# Patient Record
Sex: Female | Born: 1980 | Race: Black or African American | Hispanic: No | Marital: Single | Smoking: Former smoker
Health system: Southern US, Community
[De-identification: ages and names within clinical notes are randomized; demographics above are authoritative.]

## PROBLEM LIST (undated history)

## (undated) DIAGNOSIS — F419 Anxiety disorder, unspecified: Secondary | ICD-10-CM

## (undated) DIAGNOSIS — E669 Obesity, unspecified: Secondary | ICD-10-CM

## (undated) DIAGNOSIS — E7800 Pure hypercholesterolemia, unspecified: Secondary | ICD-10-CM

## (undated) DIAGNOSIS — F32A Depression, unspecified: Secondary | ICD-10-CM

## (undated) DIAGNOSIS — I10 Essential (primary) hypertension: Secondary | ICD-10-CM

## (undated) DIAGNOSIS — R12 Heartburn: Secondary | ICD-10-CM

## (undated) DIAGNOSIS — M1990 Unspecified osteoarthritis, unspecified site: Secondary | ICD-10-CM

## (undated) HISTORY — DX: Anxiety disorder, unspecified: F41.9

## (undated) HISTORY — DX: Heartburn: R12

## (undated) HISTORY — DX: Depression, unspecified: F32.A

## (undated) HISTORY — DX: Unspecified osteoarthritis, unspecified site: M19.90

---

## 2012-10-03 ENCOUNTER — Ambulatory Visit: Payer: Self-pay | Admitting: Physician Assistant

## 2013-01-20 ENCOUNTER — Emergency Department: Payer: Self-pay | Admitting: Emergency Medicine

## 2013-04-05 ENCOUNTER — Emergency Department: Payer: Self-pay | Admitting: Emergency Medicine

## 2013-07-12 ENCOUNTER — Emergency Department: Payer: Self-pay | Admitting: Emergency Medicine

## 2015-02-14 ENCOUNTER — Emergency Department (HOSPITAL_COMMUNITY): Payer: Self-pay

## 2015-02-14 ENCOUNTER — Encounter (HOSPITAL_COMMUNITY): Payer: Self-pay | Admitting: Emergency Medicine

## 2015-02-14 ENCOUNTER — Emergency Department (HOSPITAL_COMMUNITY)
Admission: EM | Admit: 2015-02-14 | Discharge: 2015-02-14 | Disposition: A | Discharge status: Home / Self Care | Payer: Self-pay | Attending: Emergency Medicine | Admitting: Emergency Medicine

## 2015-02-14 DIAGNOSIS — I10 Essential (primary) hypertension: Secondary | ICD-10-CM | POA: Insufficient documentation

## 2015-02-14 DIAGNOSIS — Z3202 Encounter for pregnancy test, result negative: Secondary | ICD-10-CM | POA: Insufficient documentation

## 2015-02-14 DIAGNOSIS — Z8639 Personal history of other endocrine, nutritional and metabolic disease: Secondary | ICD-10-CM | POA: Insufficient documentation

## 2015-02-14 DIAGNOSIS — E663 Overweight: Secondary | ICD-10-CM | POA: Insufficient documentation

## 2015-02-14 DIAGNOSIS — Z87891 Personal history of nicotine dependence: Secondary | ICD-10-CM | POA: Insufficient documentation

## 2015-02-14 DIAGNOSIS — R0789 Other chest pain: Secondary | ICD-10-CM | POA: Insufficient documentation

## 2015-02-14 HISTORY — DX: Essential (primary) hypertension: I10

## 2015-02-14 HISTORY — DX: Pure hypercholesterolemia, unspecified: E78.00

## 2015-02-14 LAB — COMPREHENSIVE METABOLIC PANEL
ALK PHOS: 52 U/L (ref 38–126)
ALT: 16 U/L (ref 14–54)
AST: 24 U/L (ref 15–41)
Albumin: 3.4 g/dL — ABNORMAL LOW (ref 3.5–5.0)
Anion gap: 10 (ref 5–15)
BILIRUBIN TOTAL: 0.5 mg/dL (ref 0.3–1.2)
BUN: 7 mg/dL (ref 6–20)
CALCIUM: 8.9 mg/dL (ref 8.9–10.3)
CO2: 27 mmol/L (ref 22–32)
CREATININE: 0.97 mg/dL (ref 0.44–1.00)
Chloride: 102 mmol/L (ref 101–111)
GFR calc Af Amer: >60 mL/min (ref >60)
GFR calc non Af Amer: >60 mL/min (ref >60)
GLUCOSE: 97 mg/dL (ref 65–99)
Potassium: 3.9 mmol/L (ref 3.5–5.1)
Sodium: 139 mmol/L (ref 135–145)
Total Protein: 6.6 g/dL (ref 6.5–8.1)

## 2015-02-14 LAB — CBC WITH DIFFERENTIAL/PLATELET
Basophils Absolute: 0 10*3/uL (ref 0.0–0.1)
Basophils Relative: 0 %
EOS ABS: 0.2 10*3/uL (ref 0.0–0.7)
Eosinophils Relative: 2 %
HCT: 37 % (ref 36.0–46.0)
HEMOGLOBIN: 11.9 g/dL — AB (ref 12.0–15.0)
LYMPHS ABS: 1.9 10*3/uL (ref 0.7–4.0)
Lymphocytes Relative: 29 %
MCH: 25.5 pg — AB (ref 26.0–34.0)
MCHC: 32.2 g/dL (ref 30.0–36.0)
MCV: 79.4 fL (ref 78.0–100.0)
MONOS PCT: 5 %
Monocytes Absolute: 0.3 10*3/uL (ref 0.1–1.0)
NEUTROS PCT: 64 %
Neutro Abs: 4.2 10*3/uL (ref 1.7–7.7)
Platelets: 253 10*3/uL (ref 150–400)
RBC: 4.66 MIL/uL (ref 3.87–5.11)
RDW: 15.1 % (ref 11.5–15.5)
WBC: 6.6 10*3/uL (ref 4.0–10.5)

## 2015-02-14 LAB — I-STAT CHEM 8, ED
BUN: 11 mg/dL (ref 6–20)
CREATININE: 0.9 mg/dL (ref 0.44–1.00)
Calcium, Ion: 1.1 mmol/L — ABNORMAL LOW (ref 1.12–1.23)
Chloride: 101 mmol/L (ref 101–111)
Glucose, Bld: 86 mg/dL (ref 65–99)
HEMATOCRIT: 40 % (ref 36.0–46.0)
HEMOGLOBIN: 13.6 g/dL (ref 12.0–15.0)
Potassium: 3.8 mmol/L (ref 3.5–5.1)
Sodium: 137 mmol/L (ref 135–145)
TCO2: 27 mmol/L (ref 0–100)

## 2015-02-14 LAB — I-STAT BETA HCG BLOOD, ED (MC, WL, AP ONLY): I-stat hCG, quantitative: <5 m[IU]/mL (ref <5)

## 2015-02-14 LAB — I-STAT TROPONIN, ED: TROPONIN I, POC: 0 ng/mL (ref 0.00–0.08)

## 2015-02-14 MED ORDER — IBUPROFEN 800 MG PO TABS: 800.0000 mg | ORAL_TABLET | Freq: Three times a day (TID) | ORAL | Status: DC

## 2015-02-14 MED ORDER — CYCLOBENZAPRINE HCL 10 MG PO TABS
5.0000 mg | ORAL_TABLET | Freq: Once | ORAL | Status: AC
  Administered 2015-02-14: 5 mg via ORAL
  Filled 2015-02-14: qty 1

## 2015-02-14 MED ORDER — KETOROLAC TROMETHAMINE 30 MG/ML IJ SOLN
30.0000 mg | Freq: Once | INTRAMUSCULAR | Status: AC
  Administered 2015-02-14: 30 mg via INTRAVENOUS
  Filled 2015-02-14: qty 1

## 2015-02-14 MED ORDER — CYCLOBENZAPRINE HCL 10 MG PO TABS: 10.0000 mg | ORAL_TABLET | Freq: Two times a day (BID) | ORAL | Status: DC | PRN

## 2015-02-14 NOTE — Discharge Instructions (Signed)
Take motrin for pain.  Take flexeril for muscle spasms.   See your doctor.   Return to ER if you have worse chest pain, trouble breathing, shortness of breath.

## 2015-02-14 NOTE — ED Provider Notes (Signed)
CSN: [id number]     Arrival date & time 02/14/15  2100 History   First MD Initiated Contact with Patient 02/14/15 2101     Chief Complaint  Patient presents with  . Chest Pain     (Consider location/radiation/quality/duration/timing/severity/associated sxs/prior Treatment) The history is provided by the patient.  Sharon Jennings is a 34 y.o. female hx of HTN, HL here with chest pain. Patient states that she has chest heaviness for the last week or so. She states that she sometimes feels some sharp stabbing pain that comes and goes. She has some subjective foods of breath. Denies any palpitations and denies any recent travel or leg swelling. No history of DVT or PE. Patient was a former smoker. She has family hx of CAD but no personal history. She has no family hx of early cardiac death.   Past Medical History  Diagnosis Date  . Hypertension   . High cholesterol    Past Surgical History  Procedure Laterality Date  . Cesarean section  2005   History reviewed. No pertinent family history. Social History  Substance Use Topics  . Smoking status: Former Smoker    Quit date: 02/21/2014  . Smokeless tobacco: None  . Alcohol Use: No   OB History    No data available     Review of Systems  Cardiovascular: Positive for chest pain.  All other systems reviewed and are negative.     Allergies  Ranitidine and Nexium  Home Medications   Prior to Admission medications   Medication Sig Start Date End Date Taking? Authorizing Provider  ibuprofen (ADVIL,MOTRIN) 400 MG tablet Take 400 mg by mouth every 6 (six) hours as needed for moderate pain.   Yes Historical Provider, MD   BP 133/92 mmHg  Pulse 64  Temp(Src) 97.9 F (36.6 C) (Oral)  Resp 25  SpO2 96%  LMP 01/31/2015 (Within Days) Physical Exam  Constitutional: She is oriented to person, place, and time. She appears well-developed and well-nourished.  Overweight   HENT:  Head: Normocephalic.  Mouth/Throat: Oropharynx is  clear and moist.  Eyes: Conjunctivae and EOM are normal. Pupils are equal, round, and reactive to light.  Neck: Normal range of motion. Neck supple.  Cardiovascular: Normal rate, regular rhythm and normal heart sounds.   Pulmonary/Chest: Effort normal and breath sounds normal. No respiratory distress. She has no wheezes. She has no rales.  + reproducible L chest tenderness.   Abdominal: Soft. Bowel sounds are normal. She exhibits no distension. There is no tenderness. There is no rebound.  Musculoskeletal: Normal range of motion. She exhibits no edema or tenderness.  Neurological: She is alert and oriented to person, place, and time. No cranial nerve deficit. Coordination normal.  Skin: Skin is warm and dry.  Psychiatric: She has a normal mood and affect. Her behavior is normal. Judgment and thought content normal.  Nursing note and vitals reviewed.   ED Course  Procedures (including critical care time) Labs Review Labs Reviewed  CBC WITH DIFFERENTIAL/PLATELET - Abnormal; Notable for the following:    Hemoglobin 11.9 (*)    MCH 25.5 (*)    All other components within normal limits  COMPREHENSIVE METABOLIC PANEL - Abnormal; Notable for the following:    Albumin 3.4 (*)    All other components within normal limits  I-STAT CHEM 8, ED - Abnormal; Notable for the following:    Calcium, Ion 1.10 (*)    All other components within normal limits  I-STAT TROPOININ, ED  I-STAT  BETA HCG BLOOD, ED ([facility], [facility], [facility] ONLY)    Imaging Review Dg Chest 2 View  02/14/2015  CLINICAL DATA:  Mid chest pain as well as left upper chest pain and left lateral chest pain. EXAM: CHEST  2 VIEW COMPARISON:  10/03/2012 FINDINGS: Lungs are adequately inflated without consolidation or effusion. No pneumothorax. Cardiomediastinal silhouette, bones and soft tissues are within normal. IMPRESSION: No active cardiopulmonary disease. Electronically Signed   By: Elberta Fortisaniel  Boyle M.D.   On: 02/14/2015 22:00   I have personally  reviewed and evaluated these images and lab results as part of my medical decision-making.   EKG Interpretation   Date/Time:  Thursday February 14 2015 21:03:59 EST Ventricular Rate:  71 PR Interval:  142 QRS Duration: 88 QT Interval:  394 QTC Calculation: 428 R Axis:   87 Text Interpretation:  Sinus rhythm Consider left atrial enlargement Low  voltage, precordial leads No previous ECGs available Confirmed by Elan Mcelvain  MD,  Damen Windsor ([id number]) on 02/14/2015 9:13:34 PM      MDM   Final diagnoses:  None    Sharon Jennings is a 34 y.o. female here with chest pain, appears reproducible. Chest pain for about a week. Not on birth control. Trop x 1 sufficient, PERC neg so doesn't need D-dimer. Will get labs, trop, CXR.   11:17 PM Labs and CXR unremarkable. Pain improved with motrin, flexeril. Likely chest wall muscle pain. Will dc home.    Richardean Canalavid H Clela Hagadorn, MD 02/14/15 [phone]

## 2015-02-14 NOTE — ED Notes (Signed)
Pt reports chest heaviness with intermittent sharp pains x 1 week. Pt also reports SOB. Pt received 1 NTG en route with no relief. Pt also received 324 ASA. CBG 99.

## 2015-04-28 ENCOUNTER — Encounter (HOSPITAL_COMMUNITY): Payer: Self-pay | Admitting: Emergency Medicine

## 2015-04-28 DIAGNOSIS — I10 Essential (primary) hypertension: Secondary | ICD-10-CM | POA: Insufficient documentation

## 2015-04-28 DIAGNOSIS — N898 Other specified noninflammatory disorders of vagina: Secondary | ICD-10-CM | POA: Insufficient documentation

## 2015-04-28 DIAGNOSIS — R21 Rash and other nonspecific skin eruption: Secondary | ICD-10-CM | POA: Insufficient documentation

## 2015-04-28 LAB — URINALYSIS, ROUTINE W REFLEX MICROSCOPIC
BILIRUBIN URINE: NEGATIVE
GLUCOSE, UA: NEGATIVE mg/dL
HGB URINE DIPSTICK: NEGATIVE
Ketones, ur: NEGATIVE mg/dL
Leukocytes, UA: NEGATIVE
Nitrite: NEGATIVE
PROTEIN: NEGATIVE mg/dL
Specific Gravity, Urine: 1.028 (ref 1.005–1.030)
pH: 7.5 (ref 5.0–8.0)

## 2015-04-28 LAB — POC URINE PREG, ED: PREG TEST UR: NEGATIVE

## 2015-04-28 NOTE — ED Notes (Signed)
C/o generalized rash all over x 3 days.  States it gets better and then comes back worse.  Also reports vaginal itching and clear vaginal discharge x 3 days.

## 2015-04-29 ENCOUNTER — Emergency Department (HOSPITAL_COMMUNITY)
Admission: EM | Admit: 2015-04-29 | Discharge: 2015-04-29 | Disposition: A | Discharge status: Home / Self Care | Payer: Self-pay | Attending: Emergency Medicine | Admitting: Emergency Medicine

## 2015-04-29 NOTE — ED Notes (Signed)
Per registration pt left

## 2016-03-30 ENCOUNTER — Emergency Department
Admission: EM | Admit: 2016-03-30 | Discharge: 2016-03-30 | Disposition: A | Discharge status: Home / Self Care | Payer: Self-pay | Attending: Emergency Medicine | Admitting: Emergency Medicine

## 2016-03-30 ENCOUNTER — Encounter: Payer: Self-pay | Admitting: Emergency Medicine

## 2016-03-30 DIAGNOSIS — I10 Essential (primary) hypertension: Secondary | ICD-10-CM | POA: Insufficient documentation

## 2016-03-30 DIAGNOSIS — K5901 Slow transit constipation: Secondary | ICD-10-CM | POA: Insufficient documentation

## 2016-03-30 DIAGNOSIS — Z87891 Personal history of nicotine dependence: Secondary | ICD-10-CM | POA: Insufficient documentation

## 2016-03-30 DIAGNOSIS — R1012 Left upper quadrant pain: Secondary | ICD-10-CM

## 2016-03-30 LAB — COMPREHENSIVE METABOLIC PANEL
ALBUMIN: 3.9 g/dL (ref 3.5–5.0)
ALK PHOS: 43 U/L (ref 38–126)
ALT: 16 U/L (ref 14–54)
ANION GAP: 6 (ref 5–15)
AST: 28 U/L (ref 15–41)
BUN: 11 mg/dL (ref 6–20)
CO2: 28 mmol/L (ref 22–32)
Calcium: 9 mg/dL (ref 8.9–10.3)
Chloride: 105 mmol/L (ref 101–111)
Creatinine, Ser: 0.91 mg/dL (ref 0.44–1.00)
GFR calc non Af Amer: >60 mL/min (ref >60)
GLUCOSE: 95 mg/dL (ref 65–99)
Potassium: 4 mmol/L (ref 3.5–5.1)
Sodium: 139 mmol/L (ref 135–145)
Total Bilirubin: 0.5 mg/dL (ref 0.3–1.2)
Total Protein: 7.3 g/dL (ref 6.5–8.1)

## 2016-03-30 LAB — CBC
HCT: 39.5 % (ref 35.0–47.0)
HEMOGLOBIN: 13.2 g/dL (ref 12.0–16.0)
MCH: 27 pg (ref 26.0–34.0)
MCHC: 33.4 g/dL (ref 32.0–36.0)
MCV: 80.7 fL (ref 80.0–100.0)
Platelets: 258 10*3/uL (ref 150–440)
RBC: 4.9 MIL/uL (ref 3.80–5.20)
RDW: 15.3 % — AB (ref 11.5–14.5)
WBC: 5.7 10*3/uL (ref 3.6–11.0)

## 2016-03-30 LAB — URINALYSIS, COMPLETE (UACMP) WITH MICROSCOPIC
Bacteria, UA: NONE SEEN
Bilirubin Urine: NEGATIVE
GLUCOSE, UA: NEGATIVE mg/dL
HGB URINE DIPSTICK: NEGATIVE
Ketones, ur: NEGATIVE mg/dL
NITRITE: NEGATIVE
Protein, ur: NEGATIVE mg/dL
SPECIFIC GRAVITY, URINE: 1.017 (ref 1.005–1.030)
pH: 8 (ref 5.0–8.0)

## 2016-03-30 LAB — LIPASE, BLOOD: Lipase: 26 U/L (ref 11–51)

## 2016-03-30 LAB — POCT PREGNANCY, URINE: Preg Test, Ur: NEGATIVE

## 2016-03-30 MED ORDER — HYDROCHLOROTHIAZIDE 25 MG PO TABS: 25.0000 mg | ORAL_TABLET | Freq: Every day | ORAL | 1 refills | Status: DC

## 2016-03-30 MED ORDER — BISACODYL 5 MG PO TBEC: 5.0000 mg | DELAYED_RELEASE_TABLET | Freq: Every day | ORAL | 1 refills | Status: AC | PRN

## 2016-03-30 NOTE — ED Notes (Signed)
States burning and sharp pain LLQ for 3 weeks, states increased pain with eating, states bloated, at present pt awake and alert

## 2016-03-30 NOTE — ED Triage Notes (Signed)
L lower abd pain x 3 weeks, denies diarrhea.

## 2016-03-30 NOTE — ED Provider Notes (Signed)
[facility] Emergency Department Provider Note   ____________________________________________    I have reviewed the triage vital signs and the nursing notes.   HISTORY  Chief Complaint Abdominal Pain     HPI Sharon Jennings is a 36 y.o. female who presents with complaints of aching in her left upper abdomen over the last several weeks. She reports the pain is mild and appears to be worse when she is moving or twisting. She denies nausea or vomiting. She does report a history of constipation and irregular bowel movements. She is concerned because she looked online and is worried that she may have pancreatic cancer. No fevers or chills.    Past Medical History:  Diagnosis Date  . High cholesterol   . Hypertension     There are no active problems to display for this patient.   Past Surgical History:  Procedure Laterality Date  . CESAREAN SECTION  2005    Prior to Admission medications   Medication Sig Start Date End Date Taking? Authorizing Provider  bisacodyl (DULCOLAX) 5 MG EC tablet Take 1 tablet (5 mg total) by mouth daily as needed for moderate constipation. 03/30/16 03/30/17  Jene Every, MD  cyclobenzaprine (FLEXERIL) 10 MG tablet Take 1 tablet (10 mg total) by mouth 2 (two) times daily as needed for muscle spasms. 02/14/15   Charlynne Pander, MD  hydrochlorothiazide (HYDRODIURIL) 25 MG tablet Take 1 tablet (25 mg total) by mouth daily. 03/30/16   Jene Every, MD  ibuprofen (ADVIL,MOTRIN) 800 MG tablet Take 1 tablet (800 mg total) by mouth 3 (three) times daily. 02/14/15   Charlynne Pander, MD     Allergies Ranitidine and Nexium [esomeprazole magnesium]  No family history on file.  Social History Social History  Substance Use Topics  . Smoking status: Former Smoker    Quit date: 02/21/2014  . Smokeless tobacco: Not on file  . Alcohol use No    Review of Systems  Constitutional: No fever/chills  Cardiovascular: Denies chest  pain. Respiratory: Denies shortness of breath. Gastrointestinal: As above Genitourinary: Negative for dysuria. Musculoskeletal: Negative for back pain. Skin: Negative for rash. Neurological: Negative for headaches   10-point ROS otherwise negative.  ____________________________________________   PHYSICAL EXAM:  VITAL SIGNS: ED Triage Vitals  Enc Vitals Group     BP 03/30/16 1223 (!) 164/110     Pulse Rate 03/30/16 1223 68     Resp 03/30/16 1223 18     Temp 03/30/16 1223 98.2 F (36.8 C)     Temp Source 03/30/16 1223 Oral     SpO2 03/30/16 1223 100 %     Weight 03/30/16 1224 240 lb (108.9 kg)     Height 03/30/16 1224 5\' 1"  (1.549 m)     Head Circumference --      Peak Flow --      Pain Score 03/30/16 1224 7     Pain Loc --      Pain Edu? --      Excl. in GC? --     Constitutional: Alert and oriented. No acute distress. Pleasant and interactive Eyes: Conjunctivae are normal.   Nose: No congestion/rhinnorhea. Mouth/Throat: Mucous membranes are moist.   Neck:  Painless ROM Cardiovascular: Normal rate, regular rhythm. Grossly normal heart sounds.  Good peripheral circulation. Respiratory: Normal respiratory effort.  No retractions. Lungs CTAB. Gastrointestinal: Soft and nontender. No distention.  No CVA tenderness. Genitourinary: deferred Musculoskeletal: No lower extremity tenderness nor edema.  Warm and well  perfused Neurologic:  Normal speech and language. No gross focal neurologic deficits are appreciated.  Skin:  Skin is warm, dry and intact. No rash noted. Psychiatric: Mood and affect are normal. Speech and behavior are normal.  ____________________________________________   LABS (all labs ordered are listed, but only abnormal results are displayed)  Labs Reviewed  CBC - Abnormal; Notable for the following:       Result Value   RDW 15.3 (*)    All other components within normal limits  URINALYSIS, COMPLETE (UACMP) WITH MICROSCOPIC - Abnormal; Notable for  the following:    Color, Urine YELLOW (*)    APPearance CLEAR (*)    Leukocytes, UA TRACE (*)    Squamous Epithelial / LPF 0-5 (*)    All other components within normal limits  LIPASE, BLOOD  COMPREHENSIVE METABOLIC PANEL  POC URINE PREG, ED  POCT PREGNANCY, URINE   ____________________________________________  EKG  None ____________________________________________  RADIOLOGY  None ____________________________________________   PROCEDURES  Procedure(s) performed: No    Critical Care performed: No ____________________________________________   INITIAL IMPRESSION / ASSESSMENT AND PLAN / ED COURSE  Pertinent labs & imaging results that were available during my care of the patient were reviewed by me and considered in my medical decision making (see chart for details).  Patient well-appearing and in no acute distress. Her exam is benign. Her lab work is reassuring. I suspect she is having pain from constipation. She is also requesting refill of her blood pressure medication which we will do. Discussed with her the need for outpatient follow-up. Return precautions discussed ____________________________________________   FINAL CLINICAL IMPRESSION(S) / ED DIAGNOSES  Final diagnoses:  Slow transit constipation  Left upper quadrant pain      NEW MEDICATIONS STARTED DURING THIS VISIT:  Discharge Medication List as of 03/30/2016  1:48 PM    START taking these medications   Details  bisacodyl (DULCOLAX) 5 MG EC tablet Take 1 tablet (5 mg total) by mouth daily as needed for moderate constipation., Starting Mon 03/30/2016, Until Tue 03/30/2017, Print    hydrochlorothiazide (HYDRODIURIL) 25 MG tablet Take 1 tablet (25 mg total) by mouth daily., Starting Mon 03/30/2016, Print         Note:  This document was prepared using Dragon voice recognition software and may include unintentional dictation errors.    Jene Everyobert Khari Lett, MD 03/30/16 [phone]

## 2016-09-23 ENCOUNTER — Ambulatory Visit (INDEPENDENT_AMBULATORY_CARE_PROVIDER_SITE_OTHER): Payer: Self-pay | Admitting: Physician Assistant

## 2016-12-02 ENCOUNTER — Ambulatory Visit: Payer: Self-pay | Admitting: Obstetrics

## 2017-03-14 ENCOUNTER — Other Ambulatory Visit: Payer: Self-pay

## 2017-03-14 ENCOUNTER — Encounter (HOSPITAL_COMMUNITY): Payer: Self-pay | Admitting: *Deleted

## 2017-03-14 ENCOUNTER — Emergency Department (HOSPITAL_COMMUNITY): Payer: Private Health Insurance - Indemnity

## 2017-03-14 ENCOUNTER — Emergency Department (HOSPITAL_COMMUNITY)
Admission: EM | Admit: 2017-03-14 | Discharge: 2017-03-14 | Disposition: A | Discharge status: Home / Self Care | Payer: Private Health Insurance - Indemnity | Attending: Emergency Medicine | Admitting: Emergency Medicine

## 2017-03-14 DIAGNOSIS — Z79899 Other long term (current) drug therapy: Secondary | ICD-10-CM | POA: Insufficient documentation

## 2017-03-14 DIAGNOSIS — I10 Essential (primary) hypertension: Secondary | ICD-10-CM | POA: Diagnosis not present

## 2017-03-14 DIAGNOSIS — J111 Influenza due to unidentified influenza virus with other respiratory manifestations: Secondary | ICD-10-CM | POA: Diagnosis not present

## 2017-03-14 DIAGNOSIS — Z87891 Personal history of nicotine dependence: Secondary | ICD-10-CM | POA: Diagnosis not present

## 2017-03-14 DIAGNOSIS — R6889 Other general symptoms and signs: Secondary | ICD-10-CM

## 2017-03-14 DIAGNOSIS — J029 Acute pharyngitis, unspecified: Secondary | ICD-10-CM | POA: Diagnosis present

## 2017-03-14 HISTORY — DX: Obesity, unspecified: E66.9

## 2017-03-14 LAB — URINALYSIS, ROUTINE W REFLEX MICROSCOPIC
Bilirubin Urine: NEGATIVE
Glucose, UA: NEGATIVE mg/dL
Hgb urine dipstick: NEGATIVE
KETONES UR: NEGATIVE mg/dL
LEUKOCYTES UA: NEGATIVE
NITRITE: NEGATIVE
PROTEIN: NEGATIVE mg/dL
Specific Gravity, Urine: 1.019 (ref 1.005–1.030)
pH: 6 (ref 5.0–8.0)

## 2017-03-14 LAB — PREGNANCY, URINE: Preg Test, Ur: NEGATIVE

## 2017-03-14 MED ORDER — GI COCKTAIL ~~LOC~~
30.0000 mL | Freq: Once | ORAL | Status: AC
  Administered 2017-03-14: 30 mL via ORAL
  Filled 2017-03-14: qty 30

## 2017-03-14 MED ORDER — BENZONATATE 100 MG PO CAPS: 100.0000 mg | ORAL_CAPSULE | Freq: Three times a day (TID) | ORAL | 0 refills | Status: DC | PRN

## 2017-03-14 MED ORDER — KETOROLAC TROMETHAMINE 10 MG PO TABS
10.0000 mg | ORAL_TABLET | Freq: Once | ORAL | Status: AC
  Administered 2017-03-14: 10 mg via ORAL
  Filled 2017-03-14: qty 1

## 2017-03-14 MED ORDER — ACETAMINOPHEN 500 MG PO TABS: 500.0000 mg | ORAL_TABLET | Freq: Three times a day (TID) | ORAL | 0 refills | Status: DC | PRN

## 2017-03-14 MED ORDER — BENZONATATE 100 MG PO CAPS
200.0000 mg | ORAL_CAPSULE | Freq: Once | ORAL | Status: AC
  Administered 2017-03-14: 200 mg via ORAL
  Filled 2017-03-14: qty 2

## 2017-03-14 MED ORDER — PHENOL 1.4 % MT LIQD: 1.0000 | OROMUCOSAL | 0 refills | Status: DC | PRN

## 2017-03-14 MED ORDER — IBUPROFEN 600 MG PO TABS: 600.0000 mg | ORAL_TABLET | Freq: Four times a day (QID) | ORAL | 0 refills | Status: DC | PRN

## 2017-03-14 MED ORDER — ACETAMINOPHEN 500 MG PO TABS
1000.0000 mg | ORAL_TABLET | Freq: Once | ORAL | Status: AC
  Administered 2017-03-14: 1000 mg via ORAL
  Filled 2017-03-14: qty 2

## 2017-03-14 NOTE — ED Provider Notes (Signed)
[facility] EMERGENCY DEPARTMENT Provider Note   CSN: [id number] Arrival date & time: 03/14/17  1609     History   Chief Complaint Chief Complaint  Patient presents with  . Cough  . Sore Throat    HPI Sharon Jennings is a 37 y.o. female.   37 year old female with a history of dyslipidemia and hypertension presents to the emergency department for flulike symptoms.  She states that symptoms began with a sore throat yesterday.  She has developed chills as well as subjective fever.  Patient also notes body aches today and a productive cough.  She has had some chest discomfort with coughing and breathing.  With prolonged exertion, she states that she feels short of breath.  Patient also endorsing lower abdominal discomfort.  She believes she has been urinating more frequently.  No dysuria.  She does not recall any specific sick contacts, but works at [employer] and may have come into contact with someone sick there.  She has not had any vomiting or diarrhea.  No medications taken prior to arrival for symptoms.      Past Medical History:  Diagnosis Date  . High cholesterol   . Hypertension   . Obesity     There are no active problems to display for this patient.   Past Surgical History:  Procedure Laterality Date  . CESAREAN SECTION  2005    OB History    No data available       Home Medications    Prior to Admission medications   Medication Sig Start Date End Date Taking? Authorizing Provider  acetaminophen (TYLENOL) 500 MG tablet Take 1 tablet (500 mg total) by mouth every 8 (eight) hours as needed for fever. 03/14/17   Antony Madura, PA-C  benzonatate (TESSALON) 100 MG capsule Take 1-2 capsules (100-200 mg total) by mouth 3 (three) times daily as needed for cough. 03/14/17   Antony Madura, PA-C  bisacodyl (DULCOLAX) 5 MG EC tablet Take 1 tablet (5 mg total) by mouth daily as needed for moderate constipation. 03/30/16 03/30/17  Jene Every, MD    cyclobenzaprine (FLEXERIL) 10 MG tablet Take 1 tablet (10 mg total) by mouth 2 (two) times daily as needed for muscle spasms. 02/14/15   Charlynne Pander, MD  hydrochlorothiazide (HYDRODIURIL) 25 MG tablet Take 1 tablet (25 mg total) by mouth daily. 03/30/16   Jene Every, MD  ibuprofen (ADVIL,MOTRIN) 600 MG tablet Take 1 tablet (600 mg total) by mouth every 6 (six) hours as needed for mild pain, moderate pain or cramping. 03/14/17   Antony Madura, PA-C  phenol (CHLORASEPTIC) 1.4 % LIQD Use as directed 1 spray in the mouth or throat as needed for throat irritation / pain. 03/14/17   Antony Madura, PA-C    Family History History reviewed. No pertinent family history.  Social History Social History   Tobacco Use  . Smoking status: Former Smoker    Last attempt to quit: 02/21/2014    Years since quitting: 3.0  Substance Use Topics  . Alcohol use: No  . Drug use: No     Allergies   Ranitidine and Nexium [esomeprazole magnesium]   Review of Systems Review of Systems Ten systems reviewed and are negative for acute change, except as noted in the HPI.    Physical Exam Updated Vital Signs BP 117/68 (BP Location: Left Arm)   Pulse 72   Temp 98.2 F (36.8 C) (Oral)   Resp 14   LMP 02/28/2017   SpO2  100%   Physical Exam  Constitutional: She is oriented to person, place, and time. She appears well-developed and well-nourished. No distress.  Nontoxic appearing  HENT:  Head: Normocephalic and atraumatic.  Tolerating secretions without difficulty. No tripoding or stridor.  Eyes: Conjunctivae and EOM are normal. No scleral icterus.  Neck: Normal range of motion.  Cardiovascular: Normal rate, regular rhythm and intact distal pulses.  Pulmonary/Chest: Effort normal. No stridor. No respiratory distress. She has no wheezes. She has no rales.  Lungs CTAB. Respirations even and unlabored.  Abdominal: Soft. She exhibits no distension and no mass.  Mild suprapubic abdominal TTP. No masses  or peritoneal signs.  Musculoskeletal: Normal range of motion.  Neurological: She is alert and oriented to person, place, and time. She exhibits normal muscle tone. Coordination normal.  Skin: Skin is warm and dry. No rash noted. She is not diaphoretic. No erythema. No pallor.  Psychiatric: She has a normal mood and affect. Her behavior is normal.  Nursing note and vitals reviewed.    ED Treatments / Results  Labs (all labs ordered are listed, but only abnormal results are displayed) Labs Reviewed  URINALYSIS, ROUTINE W REFLEX MICROSCOPIC  PREGNANCY, URINE    EKG  EKG Interpretation None       Radiology Dg Chest 2 View  Result Date: 03/14/2017 CLINICAL DATA:  Flu-like symptoms since Friday. Cough, sneezing, headache, and body aches. Abdominal pain. Fever 2 days ago. EXAM: CHEST  2 VIEW COMPARISON:  02/14/2015 FINDINGS: The heart size and mediastinal contours are within normal limits. Both lungs are clear. The visualized skeletal structures are unremarkable. IMPRESSION: No active cardiopulmonary disease. Electronically Signed   By: Burman Nieves M.D.   On: 03/14/2017 21:06    Procedures Procedures (including critical care time)  Medications Ordered in ED Medications  ketorolac (TORADOL) tablet 10 mg (10 mg Oral Given 03/14/17 2050)  benzonatate (TESSALON) capsule 200 mg (200 mg Oral Given 03/14/17 2048)  gi cocktail (Maalox,Lidocaine,Donnatal) (30 mLs Oral Given 03/14/17 2050)  acetaminophen (TYLENOL) tablet 1,000 mg (1,000 mg Oral Given 03/14/17 2049)     Initial Impression / Assessment and Plan / ED Course  I have reviewed the triage vital signs and the nursing notes.  Pertinent labs & imaging results that were available during my care of the patient were reviewed by me and considered in my medical decision making (see chart for details).     Patient with symptoms consistent with influenza.  Vitals are stable, afebrile in the ED but with subjective fever PTA.  No  signs of dehydration, tolerating PO's.  Lungs are clear and CXR negative for PNA or other concerning cardiopulmonary abnormality.  She is tolerating secretions.  No meningismus.  No tripoding or stridor.  Patient will be discharged with instructions to orally hydrate, rest, and use NSAIDs for muscle aches and Tylenol for fever.  Patient will also be given a cough suppressant.  Return precautions discussed and provided. Patient discharged in stable condition with no unaddressed concerns.   Final Clinical Impressions(s) / ED Diagnoses   Final diagnoses:  Flu-like symptoms    ED Discharge Orders        Ordered    ibuprofen (ADVIL,MOTRIN) 600 MG tablet  Every 6 hours PRN     03/14/17 2202    acetaminophen (TYLENOL) 500 MG tablet  Every 8 hours PRN     03/14/17 2202    benzonatate (TESSALON) 100 MG capsule  3 times daily PRN     03/14/17 2202  phenol (CHLORASEPTIC) 1.4 % LIQD  As needed     03/14/17 2202       Antony MaduraHumes, Fatumata Kashani, Cordelia Poche-C 03/14/17 2235    Tegeler, Canary Brimhristopher J, MD 03/14/17 [phone]

## 2017-03-14 NOTE — ED Triage Notes (Signed)
Pt has multiple complaints. Reports flu like symptoms since yesterday including headache, sore throat, non productive cough, bodyaches and chest pain when coughing/breathing. No acute distress is noted at triage.

## 2017-03-14 NOTE — Discharge Instructions (Signed)
Get plenty of rest and drink plenty of fluids.  We recommend ibuprofen for body aches and Tylenol for fever.  You may take Tessalon as prescribed for cough.  Use Chloraseptic spray for sore throat.  Follow-up with a primary care doctor as soon as you are able, to ensure resolution of symptoms.  You may return to the emergency department for new or concerning symptoms.

## 2018-05-13 ENCOUNTER — Other Ambulatory Visit: Payer: Self-pay

## 2018-05-13 ENCOUNTER — Emergency Department (HOSPITAL_COMMUNITY)
Admission: EM | Admit: 2018-05-13 | Discharge: 2018-05-13 | Disposition: A | Discharge status: Home / Self Care | Payer: Private Health Insurance - Indemnity | Attending: Emergency Medicine | Admitting: Emergency Medicine

## 2018-05-13 ENCOUNTER — Encounter (HOSPITAL_COMMUNITY): Payer: Self-pay

## 2018-05-13 DIAGNOSIS — Z79899 Other long term (current) drug therapy: Secondary | ICD-10-CM | POA: Insufficient documentation

## 2018-05-13 DIAGNOSIS — E7800 Pure hypercholesterolemia, unspecified: Secondary | ICD-10-CM | POA: Insufficient documentation

## 2018-05-13 DIAGNOSIS — Z87891 Personal history of nicotine dependence: Secondary | ICD-10-CM | POA: Diagnosis not present

## 2018-05-13 DIAGNOSIS — R1012 Left upper quadrant pain: Secondary | ICD-10-CM

## 2018-05-13 DIAGNOSIS — I10 Essential (primary) hypertension: Secondary | ICD-10-CM

## 2018-05-13 LAB — CBC
HCT: 41.1 % (ref 36.0–46.0)
Hemoglobin: 12.7 g/dL (ref 12.0–15.0)
MCH: 25.2 pg — ABNORMAL LOW (ref 26.0–34.0)
MCHC: 30.9 g/dL (ref 30.0–36.0)
MCV: 81.7 fL (ref 80.0–100.0)
NRBC: 0 % (ref 0.0–0.2)
Platelets: 272 10*3/uL (ref 150–400)
RBC: 5.03 MIL/uL (ref 3.87–5.11)
RDW: 15.1 % (ref 11.5–15.5)
WBC: 5.6 10*3/uL (ref 4.0–10.5)

## 2018-05-13 LAB — URINALYSIS, ROUTINE W REFLEX MICROSCOPIC
BILIRUBIN URINE: NEGATIVE
Glucose, UA: NEGATIVE mg/dL
Hgb urine dipstick: NEGATIVE
Ketones, ur: NEGATIVE mg/dL
Leukocytes,Ua: NEGATIVE
NITRITE: NEGATIVE
Protein, ur: NEGATIVE mg/dL
Specific Gravity, Urine: 1.023 (ref 1.005–1.030)
pH: 5 (ref 5.0–8.0)

## 2018-05-13 LAB — COMPREHENSIVE METABOLIC PANEL
ALT: 13 U/L (ref 0–44)
AST: 21 U/L (ref 15–41)
Albumin: 3.5 g/dL (ref 3.5–5.0)
Alkaline Phosphatase: 51 U/L (ref 38–126)
Anion gap: 7 (ref 5–15)
BUN: 12 mg/dL (ref 6–20)
CHLORIDE: 105 mmol/L (ref 98–111)
CO2: 25 mmol/L (ref 22–32)
CREATININE: 1.08 mg/dL — AB (ref 0.44–1.00)
Calcium: 9 mg/dL (ref 8.9–10.3)
GFR calc non Af Amer: >60 mL/min (ref >60)
Glucose, Bld: 93 mg/dL (ref 70–99)
Potassium: 3.8 mmol/L (ref 3.5–5.1)
Sodium: 137 mmol/L (ref 135–145)
Total Bilirubin: 0.5 mg/dL (ref 0.3–1.2)
Total Protein: 6.7 g/dL (ref 6.5–8.1)

## 2018-05-13 LAB — PREGNANCY, URINE: Preg Test, Ur: NEGATIVE

## 2018-05-13 MED ORDER — HYDROCHLOROTHIAZIDE 12.5 MG PO TABS: 25.0000 mg | ORAL_TABLET | Freq: Every day | ORAL | 0 refills | Status: DC

## 2018-05-13 NOTE — ED Triage Notes (Signed)
Pain in LUQ and stomach cramps x2weeks, light colored bowel movements and stomach growling, worse with eating (no particular foods make it better/worse) and headache/sore throat (attributes this to allergies)  No travel or suspected COVID contact

## 2018-05-13 NOTE — ED Notes (Signed)
Discharge instructions and prescription discussed with Pt. Pt verbalized understanding. Pt stable and ambulatory.    

## 2018-05-13 NOTE — ED Provider Notes (Signed)
[facility] EMERGENCY DEPARTMENT Provider Note   CSN: [id number] Arrival date & time: 05/13/18  1423    History   Chief Complaint Chief Complaint  Patient presents with  . Abdominal Pain    HPI Sharon Jennings is a 38 y.o. female.     Patient c/o intermittent mid to left upper abd pain for the past 2 weeks. Symptoms occur at rest, occasionally worse after eating. Describes crampy, dull, mild-mod, non radiating pain. No back or flank pain. No fever or chills. Normal appetite. No nv. Is having normal bms, but states had small bm today. Denies hx pud or gallstones. No hx pancreatitis. Only prior abd surgery is remote hx csection. Denies chest pain or sob. No cough or uri symptoms. No dysuria. No vaginal discharge or  Bleeding.   The history is provided by the patient.  Constipation  Associated symptoms: abdominal pain   Associated symptoms: no back pain, no dysuria, no fever and no vomiting     Past Medical History:  Diagnosis Date  . High cholesterol   . Hypertension   . Obesity     There are no active problems to display for this patient.   Past Surgical History:  Procedure Laterality Date  . CESAREAN SECTION  2005     OB History   No obstetric history on file.      Home Medications    Prior to Admission medications   Medication Sig Start Date End Date Taking? Authorizing Provider  acetaminophen (TYLENOL) 500 MG tablet Take 1 tablet (500 mg total) by mouth every 8 (eight) hours as needed for fever. 03/14/17   Antony Madura, PA-C  benzonatate (TESSALON) 100 MG capsule Take 1-2 capsules (100-200 mg total) by mouth 3 (three) times daily as needed for cough. 03/14/17   Antony Madura, PA-C  cyclobenzaprine (FLEXERIL) 10 MG tablet Take 1 tablet (10 mg total) by mouth 2 (two) times daily as needed for muscle spasms. 02/14/15   Charlynne Pander, MD  hydrochlorothiazide (HYDRODIURIL) 25 MG tablet Take 1 tablet (25 mg total) by mouth daily. 03/30/16   Jene Every, MD  ibuprofen (ADVIL,MOTRIN) 600 MG tablet Take 1 tablet (600 mg total) by mouth every 6 (six) hours as needed for mild pain, moderate pain or cramping. 03/14/17   Antony Madura, PA-C  phenol (CHLORASEPTIC) 1.4 % LIQD Use as directed 1 spray in the mouth or throat as needed for throat irritation / pain. 03/14/17   Antony Madura, PA-C    Family History History reviewed. No pertinent family history.  Social History Social History   Tobacco Use  . Smoking status: Former Smoker    Last attempt to quit: 02/21/2014    Years since quitting: 4.2  Substance Use Topics  . Alcohol use: No  . Drug use: No     Allergies   Ranitidine and Nexium [esomeprazole magnesium]   Review of Systems Review of Systems  Constitutional: Negative for fever.  HENT: Negative for sore throat.   Eyes: Negative for redness.  Respiratory: Negative for cough and shortness of breath.   Cardiovascular: Negative for chest pain.  Gastrointestinal: Positive for abdominal pain and constipation. Negative for vomiting.  Endocrine: Negative for polyuria.  Genitourinary: Negative for dysuria and flank pain.  Musculoskeletal: Negative for back pain and neck pain.  Skin: Negative for rash.  Neurological: Negative for headaches.  Hematological: Does not bruise/bleed easily.  Psychiatric/Behavioral: Negative for confusion.     Physical Exam Updated Vital Signs BP Marland Kitchen)  173/117 (BP Location: Right Arm)   Pulse 94   Temp 98.4 F (36.9 C) (Oral)   Resp 16   Ht 1.524 m (5')   Wt 122.9 kg   SpO2 99%   BMI 52.93 kg/m   Physical Exam Vitals signs and nursing note reviewed.  Constitutional:      Appearance: Normal appearance. She is well-developed.  HENT:     Head: Atraumatic.     Nose: Nose normal.     Mouth/Throat:     Mouth: Mucous membranes are moist.  Eyes:     General: No scleral icterus.    Conjunctiva/sclera: Conjunctivae normal.     Pupils: Pupils are equal, round, and reactive to light.  Neck:      Musculoskeletal: Normal range of motion and neck supple. No neck rigidity or muscular tenderness.     Trachea: No tracheal deviation.  Cardiovascular:     Rate and Rhythm: Normal rate and regular rhythm.     Pulses: Normal pulses.     Heart sounds: Normal heart sounds. No murmur. No friction rub. No gallop.   Pulmonary:     Effort: Pulmonary effort is normal. No respiratory distress.     Breath sounds: Normal breath sounds.  Abdominal:     General: Bowel sounds are normal. There is no distension.     Palpations: Abdomen is soft. There is no mass.     Tenderness: There is no abdominal tenderness. There is no guarding or rebound.     Hernia: No hernia is present.  Genitourinary:    Comments: No cva tenderness.  Musculoskeletal:        General: No swelling.  Skin:    General: Skin is warm and dry.     Findings: No rash.  Neurological:     Mental Status: She is alert.     Comments: Alert, speech normal.   Psychiatric:        Mood and Affect: Mood normal.      ED Treatments / Results  Labs (all labs ordered are listed, but only abnormal results are displayed) Results for orders placed or performed during the hospital encounter of 05/13/18  CBC  Result Value Ref Range   WBC 5.6 4.0 - 10.5 K/uL   RBC 5.03 3.87 - 5.11 MIL/uL   Hemoglobin 12.7 12.0 - 15.0 g/dL   HCT 16.1 09.6 - 04.5 %   MCV 81.7 80.0 - 100.0 fL   MCH 25.2 (L) 26.0 - 34.0 pg   MCHC 30.9 30.0 - 36.0 g/dL   RDW 40.9 81.1 - 91.4 %   Platelets 272 150 - 400 K/uL   nRBC 0.0 0.0 - 0.2 %  Comprehensive metabolic panel  Result Value Ref Range   Sodium 137 135 - 145 mmol/L   Potassium 3.8 3.5 - 5.1 mmol/L   Chloride 105 98 - 111 mmol/L   CO2 25 22 - 32 mmol/L   Glucose, Bld 93 70 - 99 mg/dL   BUN 12 6 - 20 mg/dL   Creatinine, Ser 7.82 (H) 0.44 - 1.00 mg/dL   Calcium 9.0 8.9 - 95.6 mg/dL   Total Protein 6.7 6.5 - 8.1 g/dL   Albumin 3.5 3.5 - 5.0 g/dL   AST 21 15 - 41 U/L   ALT 13 0 - 44 U/L   Alkaline  Phosphatase 51 38 - 126 U/L   Total Bilirubin 0.5 0.3 - 1.2 mg/dL   GFR calc non Af Amer >60 >60 mL/min   GFR  calc Af Amer >60 >60 mL/min   Anion gap 7 5 - 15  Urinalysis, Routine w reflex microscopic  Result Value Ref Range   Color, Urine YELLOW YELLOW   APPearance CLEAR CLEAR   Specific Gravity, Urine 1.023 1.005 - 1.030   pH 5.0 5.0 - 8.0   Glucose, UA NEGATIVE NEGATIVE mg/dL   Hgb urine dipstick NEGATIVE NEGATIVE   Bilirubin Urine NEGATIVE NEGATIVE   Ketones, ur NEGATIVE NEGATIVE mg/dL   Protein, ur NEGATIVE NEGATIVE mg/dL   Nitrite NEGATIVE NEGATIVE   Leukocytes,Ua NEGATIVE NEGATIVE  Pregnancy, urine  Result Value Ref Range   Preg Test, Ur NEGATIVE NEGATIVE    EKG None  Radiology No results found.  Procedures Procedures (including critical care time)  Medications Ordered in ED Medications - No data to display   Initial Impression / Assessment and Plan / ED Course  I have reviewed the triage vital signs and the nursing notes.  Pertinent labs & imaging results that were available during my care of the patient were reviewed by me and considered in my medical decision making (see chart for details).  Labs sent.   Reviewed nursing notes and prior charts for additional history.   Labs reviewed - chem normal, wbc normal. u preg neg.  Recheck abd soft nt. No nv. Afebrile. bp improved. Pt indicates is out of her hctz.   Pt currently appears stable for d/c.   rx provided.      Final Clinical Impressions(s) / ED Diagnoses   Final diagnoses:  None    ED Discharge Orders    None       Cathren Laine, MD 05/13/18 [phone]

## 2018-05-13 NOTE — Discharge Instructions (Signed)
It was our pleasure to provide your ER care today - we hope that you feel better.  You may try taking pepcid or maalox as need for symptom relief.  Your blood pressure is high today - take medication as prescribed, limit salt intake, and follow up with primary care doctor in the next couple weeks.   Return to ER if worse, new symptoms, persistent/severe pain, persistent vomiting, high fevers, other concern.

## 2018-12-16 ENCOUNTER — Emergency Department (HOSPITAL_COMMUNITY)
Admission: EM | Admit: 2018-12-16 | Discharge: 2018-12-17 | Disposition: A | Discharge status: Home / Self Care | Payer: Private Health Insurance - Indemnity | Attending: Emergency Medicine | Admitting: Emergency Medicine

## 2018-12-16 ENCOUNTER — Other Ambulatory Visit: Payer: Self-pay

## 2018-12-16 ENCOUNTER — Encounter (HOSPITAL_COMMUNITY): Payer: Self-pay | Admitting: Emergency Medicine

## 2018-12-16 DIAGNOSIS — M25512 Pain in left shoulder: Secondary | ICD-10-CM | POA: Insufficient documentation

## 2018-12-16 DIAGNOSIS — Z5321 Procedure and treatment not carried out due to patient leaving prior to being seen by health care provider: Secondary | ICD-10-CM | POA: Insufficient documentation
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
## 2018-12-16 NOTE — ED Notes (Signed)
Advised patient to stay, patient stated " I am tired and have to go to work."  Patient left

## 2018-12-17 ENCOUNTER — Emergency Department
Admission: EM | Admit: 2018-12-17 | Discharge: 2018-12-17 | Disposition: A | Discharge status: Home / Self Care | Payer: No Typology Code available for payment source | Attending: Student | Admitting: Student

## 2018-12-17 ENCOUNTER — Other Ambulatory Visit: Payer: Self-pay

## 2018-12-17 ENCOUNTER — Encounter: Payer: Self-pay | Admitting: Emergency Medicine

## 2018-12-17 DIAGNOSIS — S161XXA Strain of muscle, fascia and tendon at neck level, initial encounter: Secondary | ICD-10-CM | POA: Insufficient documentation

## 2018-12-17 DIAGNOSIS — Y999 Unspecified external cause status: Secondary | ICD-10-CM | POA: Diagnosis not present

## 2018-12-17 DIAGNOSIS — S39012A Strain of muscle, fascia and tendon of lower back, initial encounter: Secondary | ICD-10-CM | POA: Insufficient documentation

## 2018-12-17 DIAGNOSIS — Z79899 Other long term (current) drug therapy: Secondary | ICD-10-CM | POA: Insufficient documentation

## 2018-12-17 DIAGNOSIS — I10 Essential (primary) hypertension: Secondary | ICD-10-CM | POA: Diagnosis not present

## 2018-12-17 DIAGNOSIS — Z87891 Personal history of nicotine dependence: Secondary | ICD-10-CM | POA: Insufficient documentation

## 2018-12-17 DIAGNOSIS — Y93I9 Activity, other involving external motion: Secondary | ICD-10-CM | POA: Diagnosis not present

## 2018-12-17 DIAGNOSIS — S3992XA Unspecified injury of lower back, initial encounter: Secondary | ICD-10-CM | POA: Diagnosis present

## 2018-12-17 DIAGNOSIS — Y92410 Unspecified street and highway as the place of occurrence of the external cause: Secondary | ICD-10-CM | POA: Diagnosis not present

## 2018-12-17 MED ORDER — MELOXICAM 15 MG PO TABS: 15.0000 mg | ORAL_TABLET | Freq: Every day | ORAL | 2 refills | Status: AC

## 2018-12-17 MED ORDER — CYCLOBENZAPRINE HCL 10 MG PO TABS: 10.0000 mg | ORAL_TABLET | Freq: Three times a day (TID) | ORAL | 0 refills | Status: DC | PRN
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
## 2018-12-17 NOTE — Discharge Instructions (Addendum)
Follow-up with your regular doctor or Dr. Mack Guise if not better in 1 week.  Feeling that you need physical therapy Dr. Harden Mo office could refer you.  Take your medication as prescribed.  Be up and active as this will help prevent your muscles from becoming stiff.  Apply ice to all areas that hurt.
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
## 2019-07-09 ENCOUNTER — Encounter (HOSPITAL_COMMUNITY): Payer: Self-pay | Admitting: Emergency Medicine

## 2019-07-09 ENCOUNTER — Other Ambulatory Visit: Payer: Self-pay

## 2019-07-09 ENCOUNTER — Emergency Department (HOSPITAL_COMMUNITY)
Admission: EM | Admit: 2019-07-09 | Discharge: 2019-07-09 | Disposition: A | Discharge status: Home / Self Care | Payer: Self-pay | Attending: Emergency Medicine | Admitting: Emergency Medicine

## 2019-07-09 DIAGNOSIS — I10 Essential (primary) hypertension: Secondary | ICD-10-CM | POA: Insufficient documentation

## 2019-07-09 DIAGNOSIS — L299 Pruritus, unspecified: Secondary | ICD-10-CM | POA: Insufficient documentation

## 2019-07-09 DIAGNOSIS — Z79899 Other long term (current) drug therapy: Secondary | ICD-10-CM | POA: Insufficient documentation

## 2019-07-09 NOTE — Discharge Instructions (Addendum)
Continue taking Benadryl 1 to 2 tablets every 4-6 hours for itching.  Return to the ER if you have worsening symptoms, worsening rash, shortness of breath, difficulty breathing, tongue swelling, throat closing up.  If your symptoms do not get better please follow-up with your primary care doctor next week.

## 2019-07-09 NOTE — ED Provider Notes (Signed)
[facility] EMERGENCY DEPARTMENT Provider Note   CSN: [id number] Arrival date & time: 07/09/19  2010     History Chief Complaint  Patient presents with  . Rash    Sharon Jennings is a 39 y.o. female.  HPI 39 year old female with history of hypertension, obesity presents to the ER with generalized body rash and itchiness which began yesterday.  Patient states that she took a shower and stated that she began to develop a generalized rash all over her body.  She has taken 2 doses of Benadryl, and the rash has improved, but the itchiness still remains.  She denies any new uses of soaps, no new dietary changes.  No new medications.  She is unaware what would have caused this reaction.  She denies any shortness of breath, chest pain, tongue swelling, nausea, vomiting, abdominal pain, fevers, chills.    Past Medical History:  Diagnosis Date  . High cholesterol   . Hypertension   . Obesity     There are no problems to display for this patient.   Past Surgical History:  Procedure Laterality Date  . CESAREAN SECTION  2005     OB History   No obstetric history on file.     No family history on file.  Social History   Tobacco Use  . Smoking status: Former Smoker    Quit date: 02/21/2014    Years since quitting: 5.3  . Smokeless tobacco: Never Used  Substance Use Topics  . Alcohol use: No  . Drug use: No    Home Medications Prior to Admission medications   Medication Sig Start Date End Date Taking? Authorizing Provider  cyclobenzaprine (FLEXERIL) 10 MG tablet Take 1 tablet (10 mg total) by mouth 3 (three) times daily as needed. 12/17/18   Fisher, Roselyn Bering, PA-C  hydrochlorothiazide (HYDRODIURIL) 12.5 MG tablet Take 2 tablets (25 mg total) by mouth daily. 05/13/18   Cathren Laine, MD  hydrochlorothiazide (HYDRODIURIL) 25 MG tablet Take 1 tablet (25 mg total) by mouth daily. 03/30/16   Jene Every, MD  meloxicam (MOBIC) 15 MG tablet Take 1 tablet (15 mg  total) by mouth daily. 12/17/18 12/17/19  Fisher, Roselyn Bering, PA-C  phenol (CHLORASEPTIC) 1.4 % LIQD Use as directed 1 spray in the mouth or throat as needed for throat irritation / pain. 03/14/17   Antony Madura, PA-C    Allergies    Ranitidine and Nexium [esomeprazole magnesium]  Review of Systems   Review of Systems  Constitutional: Negative for chills and fever.  Skin: Positive for rash.    Physical Exam Updated Vital Signs BP (!) 148/100 (BP Location: Left Arm)   Pulse 78   Temp 98.3 F (36.8 C) (Oral)   Resp 16   Ht 5\' 1"  (1.549 m)   Wt 130 kg   LMP 07/07/2019   SpO2 98%   BMI 54.15 kg/m   Physical Exam Vitals and nursing note reviewed.  Constitutional:      General: She is not in acute distress.    Appearance: She is well-developed.  HENT:     Head: Normocephalic and atraumatic.     Mouth/Throat:     Comments: Tongue normal size, uvula midline. Eyes:     Conjunctiva/sclera: Conjunctivae normal.  Cardiovascular:     Rate and Rhythm: Normal rate and regular rhythm.     Heart sounds: No murmur.  Pulmonary:     Effort: Pulmonary effort is normal. No respiratory distress.     Breath sounds:  Normal breath sounds.  Abdominal:     Palpations: Abdomen is soft.     Tenderness: There is no abdominal tenderness.  Musculoskeletal:     Cervical back: Neck supple.  Skin:    General: Skin is warm and dry.     Findings: No erythema or rash.     Comments: Skin without evidence of rash.  No bullae, sloughing.  Neurological:     Mental Status: She is alert.     ED Results / Procedures / Treatments   Labs (all labs ordered are listed, but only abnormal results are displayed) Labs Reviewed - No data to display  EKG None  Radiology No results found.  Procedures Procedures (including critical care time)  Medications Ordered in ED Medications - No data to display  ED Course  I have reviewed the triage vital signs and the nursing notes.  Pertinent labs & imaging  results that were available during my care of the patient were reviewed by me and considered in my medical decision making (see chart for details).    MDM Rules/Calculators/A&P                     39 year old female presents to the ER with complaints of rash and itching.  There is no rash evident on any part of her body on my physical exam.  Patient states that the rash has improved with Benadryl, but the itching still remains.  Unclear cause of her symptoms.  Patient is wondering if this could be secondary to her history of having Covid.  She denies any difficulty breathing or swallowing.  She has a patent airway without stridor and is handling secretions without difficulty, no angioedema.  No blisters, pustules, warmth, draining sinus tracts, no superficial abscesses, bullous impetigo, no vesicles, no disclamation, no target lesions with dusky purpura or central bulla.  Not tender to the touch.  No concern for superimposed infection.  No concern for SJS, TN, TSS, tickborne illness, syphilis, or other life-threatening condition.  Patient with allergy to Zantac, will hold Pepcid.  Encouraged her to continue to take Benadryl for pruritus.  I do not think any steroids are indicated at this time as she does not have a visible rash.  I encouraged her to follow-up with her primary care doctor if her itching does not improve.  Return precautions given which included worsening rash, shortness of breath, difficulty swallowing, tongue swelling, lip swelling.  Patient voices understanding is agreeable to this plan.  At this stage in the ED course, the patient has been medically screened and is stable for discharge.    Final Clinical Impression(s) / ED Diagnoses Final diagnoses:  Itching    Rx / DC Orders ED Discharge Orders    None       Lyndel Safe 07/09/19 2131    Elnora Morrison, MD 07/10/19 [phone]

## 2019-07-09 NOTE — ED Triage Notes (Signed)
Patient reports generalized itchy skin rashes onset yesterday unrelieved by OTC Benadryl , respirations unlabored/ no oral swelling , denies fever or chills .

## 2020-07-15 ENCOUNTER — Other Ambulatory Visit: Payer: Self-pay

## 2020-07-15 ENCOUNTER — Emergency Department
Admission: EM | Admit: 2020-07-15 | Discharge: 2020-07-15 | Disposition: A | Discharge status: Home / Self Care | Payer: Medicaid Other | Attending: Emergency Medicine | Admitting: Emergency Medicine

## 2020-07-15 ENCOUNTER — Emergency Department: Payer: Medicaid Other

## 2020-07-15 DIAGNOSIS — Z79899 Other long term (current) drug therapy: Secondary | ICD-10-CM | POA: Insufficient documentation

## 2020-07-15 DIAGNOSIS — I10 Essential (primary) hypertension: Secondary | ICD-10-CM | POA: Insufficient documentation

## 2020-07-15 DIAGNOSIS — Z20822 Contact with and (suspected) exposure to covid-19: Secondary | ICD-10-CM

## 2020-07-15 DIAGNOSIS — Z87891 Personal history of nicotine dependence: Secondary | ICD-10-CM | POA: Insufficient documentation

## 2020-07-15 DIAGNOSIS — U071 COVID-19: Secondary | ICD-10-CM

## 2020-07-15 LAB — GROUP A STREP BY PCR: Group A Strep by PCR: NOT DETECTED

## 2020-07-15 NOTE — ED Notes (Signed)
See triage note  Presents with some body aches,cough and subjective fever since Friday morning  Afebrile on arrival   Also now has h/a

## 2020-07-15 NOTE — ED Triage Notes (Addendum)
Pt comes with c/o fever, congestion and runny nose. Pt states cough with productive mucus.  Pt states no sense of smell and taste.

## 2020-07-15 NOTE — Discharge Instructions (Signed)
You are presumed to have a positive COVID test based on your close contact family member who was tested positive.  You may review your results and [facility] MyChart in the next 24 hours.  Remain under quarantine and treat symptoms over-the-counter as needed.  Follow-up with your primary provider or local urgent care if necessary.  Return to the ED if needed.

## 2020-07-15 NOTE — ED Provider Notes (Signed)
[facility] Emergency Department Provider Note ____________________________________________  Time seen: 1536  I have reviewed the triage vital signs and the nursing notes.  HISTORY  Chief Complaint  Fever   HPI Sharon Jennings is a 40 y.o. female presents to the ED accompanied by her daughter, for evaluation of flulike symptoms.  Patient reports several days of productive cough, body aches, subjective fevers.  She denies any nausea, vomiting, or diarrhea.  She does endorse being vaccinated against COVID and flu.   Past Medical History:  Diagnosis Date  . High cholesterol   . Hypertension   . Obesity     There are no problems to display for this patient.   Past Surgical History:  Procedure Laterality Date  . CESAREAN SECTION  2005    Prior to Admission medications   Medication Sig Start Date End Date Taking? Authorizing Provider  cyclobenzaprine (FLEXERIL) 10 MG tablet Take 1 tablet (10 mg total) by mouth 3 (three) times daily as needed. 12/17/18   Fisher, Roselyn Bering, PA-C  hydrochlorothiazide (HYDRODIURIL) 12.5 MG tablet Take 2 tablets (25 mg total) by mouth daily. 05/13/18   Cathren Laine, MD  hydrochlorothiazide (HYDRODIURIL) 25 MG tablet Take 1 tablet (25 mg total) by mouth daily. 03/30/16   Jene Every, MD  phenol (CHLORASEPTIC) 1.4 % LIQD Use as directed 1 spray in the mouth or throat as needed for throat irritation / pain. 03/14/17   Antony Madura, PA-C    Allergies Ranitidine and Nexium [esomeprazole magnesium]  History reviewed. No pertinent family history.  Social History Social History   Tobacco Use  . Smoking status: Former Smoker    Quit date: 02/21/2014    Years since quitting: 6.4  . Smokeless tobacco: Never Used  Substance Use Topics  . Alcohol use: No  . Drug use: No    Review of Systems  Constitutional: Positive for subjective fever. Eyes: Negative for visual changes. ENT: Negative for sore throat.  Nasal congestion  reported Cardiovascular: Negative for chest pain. Respiratory: Negative for shortness of breath.  Endorses productive cough. Gastrointestinal: Negative for abdominal pain, vomiting and diarrhea. Genitourinary: Negative for dysuria. Musculoskeletal: Negative for back pain. Skin: Negative for rash. Neurological: Negative for headaches, focal weakness or numbness. ____________________________________________  PHYSICAL EXAM:  VITAL SIGNS: ED Triage Vitals  Enc Vitals Group     BP 07/15/20 1413 132/87     Pulse Rate 07/15/20 1413 75     Resp 07/15/20 1413 18     Temp 07/15/20 1413 98.3 F (36.8 C)     Temp Source 07/15/20 1413 Oral     SpO2 07/15/20 1413 95 %     Weight 07/15/20 1524 286 lb 9.6 oz (130 kg)     Height 07/15/20 1524 5\' 1"  (1.549 m)     Head Circumference --      Peak Flow --      Pain Score 07/15/20 1415 3     Pain Loc --      Pain Edu? --      Excl. in GC? --     Constitutional: Alert and oriented. Well appearing and in no distress. Head: Normocephalic and atraumatic. Eyes: Conjunctivae are normal. Normal extraocular movements Ears: Canals clear. TMs intact bilaterally. Nose: No congestion/rhinorrhea/epistaxis. Mouth/Throat: Mucous membranes are moist. Neck: Supple. No thyromegaly. Cardiovascular: Normal rate, regular rhythm. Normal distal pulses. Respiratory: Normal respiratory effort. No wheezes/rales/rhonchi. Gastrointestinal: Soft and nontender. No distention. Musculoskeletal: Nontender with normal range of motion in all extremities.  Neurologic:  Normal gait without ataxia. Normal speech and language. No gross focal neurologic deficits are appreciated. Skin:  Skin is warm, dry and intact. No rash noted. ____________________________________________   LABS (pertinent positives/negatives) Labs Reviewed  GROUP A STREP BY PCR  SARS CORONAVIRUS 2 (TAT 6-24 HRS)  ____________________________________________   RADIOLOGY  CXR  IMPRESSION: No active  disease. ____________________________________________  PROCEDURES  Procedures ____________________________________________   INITIAL IMPRESSION / ASSESSMENT AND PLAN / ED COURSE  As part of my medical decision making, I reviewed the following data within the electronic MEDICAL RECORD NUMBER Labs reviewed pending, Radiograph reviewed WNL and Notes from prior ED visits   Patient with ED evaluation of symptoms including subjective fevers, cough and congestion.  She is evaluated for symptoms in the ED with chest x-ray and viral panel screen.  X-rays negative reassuring, strep PCR is also negative.  Her viral respiratory screen is pending at the time of this disposition.  Her daughter with whom she lives, has similar symptoms, and is COVID-positive.  Patient is presumptive positive for COVID at this time.  She will monitor symptoms as discussed, and remain in the quarantine for the remainder of this week.  Return precautions have been discussed.   Sharon Jennings was evaluated in Emergency Department on 07/15/2020 for the symptoms described in the history of present illness. She was evaluated in the context of the global COVID-19 pandemic, which necessitated consideration that the patient might be at risk for infection with the SARS-CoV-2 virus that causes COVID-19. Institutional protocols and algorithms that pertain to the evaluation of patients at risk for COVID-19 are in a state of rapid change based on information released by regulatory bodies including the CDC and federal and state organizations. These policies and algorithms were followed during the patient's care in the ED. ____________________________________________  FINAL CLINICAL IMPRESSION(S) / ED DIAGNOSES  Final diagnoses:  Clinical diagnosis of COVID-19  Close exposure to COVID-19 virus      Lissa Hoard, PA-C 07/15/20 1751    Gilles Chiquito, MD 07/15/20 2314

## 2020-07-16 LAB — SARS CORONAVIRUS 2 (TAT 6-24 HRS): SARS Coronavirus 2: POSITIVE — AB

## 2020-09-03 ENCOUNTER — Other Ambulatory Visit: Payer: Self-pay

## 2020-09-03 ENCOUNTER — Encounter: Payer: Self-pay | Admitting: Internal Medicine

## 2020-09-03 ENCOUNTER — Ambulatory Visit (INDEPENDENT_AMBULATORY_CARE_PROVIDER_SITE_OTHER): Payer: No Typology Code available for payment source | Admitting: Internal Medicine

## 2020-09-03 VITALS — BP 130/70 | HR 70 | Ht 61.5 in | Wt 295.2 lb

## 2020-09-03 DIAGNOSIS — I10 Essential (primary) hypertension: Secondary | ICD-10-CM

## 2020-09-03 DIAGNOSIS — Z6841 Body Mass Index (BMI) 40.0 and over, adult: Secondary | ICD-10-CM

## 2020-09-03 DIAGNOSIS — E66813 Obesity, class 3: Secondary | ICD-10-CM | POA: Insufficient documentation

## 2020-09-03 DIAGNOSIS — M1990 Unspecified osteoarthritis, unspecified site: Secondary | ICD-10-CM

## 2020-09-03 DIAGNOSIS — E662 Morbid (severe) obesity with alveolar hypoventilation: Secondary | ICD-10-CM

## 2020-09-03 DIAGNOSIS — G4734 Idiopathic sleep related nonobstructive alveolar hypoventilation: Secondary | ICD-10-CM | POA: Diagnosis not present

## 2020-09-03 DIAGNOSIS — F419 Anxiety disorder, unspecified: Secondary | ICD-10-CM

## 2020-09-03 DIAGNOSIS — M545 Low back pain, unspecified: Secondary | ICD-10-CM

## 2020-09-03 NOTE — Progress Notes (Signed)
New Patient Office Visit  Subjective:  Patient ID: Sharon Jennings, female    DOB: 1980-05-15  Age: 40 y.o. MRN: [id number]  CC:  Chief Complaint  Patient presents with   New Patient (Initial Visit)    HPI Patient presents for new pt visit , pt  has hyperlipidemia , reflux problem and obesity ,pt has endoscopy, did not find much, pt has covid  2 times  Past Medical History:  Diagnosis Date   Anxiety    Arthritis    Depression    Heartburn    High cholesterol    Hypertension    Obesity      Current Outpatient Medications:    amLODipine (NORVASC) 5 MG tablet, Take 5 mg by mouth daily., Disp: , Rfl:    famotidine (PEPCID) 20 MG tablet, Take 20 mg by mouth 2 (two) times daily., Disp: , Rfl:    hydrochlorothiazide (HYDRODIURIL) 12.5 MG tablet, Take 2 tablets (25 mg total) by mouth daily., Disp: 30 tablet, Rfl: 0   Past Surgical History:  Procedure Laterality Date   CESAREAN SECTION  2005    Family History  Problem Relation Age of Onset   Hypertension Mother    Cancer Father    Cancer Paternal Grandmother    Diabetes Paternal Grandfather     Social History   Socioeconomic History   Marital status: Single    Spouse name: Not on file   Number of children: Not on file   Years of education: Not on file   Highest education level: Not on file  Occupational History   Not on file  Tobacco Use   Smoking status: Former    Types: Cigarettes    Quit date: 02/21/2014    Years since quitting: 6.5   Smokeless tobacco: Never  Substance and Sexual Activity   Alcohol use: Yes   Drug use: No   Sexual activity: Yes  Other Topics Concern   Not on file  Social History Narrative   Not on file   Social Determinants of Health   Financial Resource Strain: Not on file  Food Insecurity: Not on file  Transportation Needs: Not on file  Physical Activity: Not on file  Stress: Not on file  Social Connections: Not on file  Intimate Partner Violence: Not on file    ROS Review  of Systems  Constitutional:  Positive for diaphoresis and fatigue.  HENT:  Negative for congestion, mouth sores, sinus pain and sneezing.   Eyes:  Negative for discharge.  Respiratory:  Positive for cough. Negative for chest tightness, shortness of breath and wheezing.   Cardiovascular:  Negative for chest pain.  Gastrointestinal:  Positive for abdominal pain. Negative for diarrhea and vomiting.  Genitourinary:  Positive for flank pain. Negative for dysuria, menstrual problem and vaginal discharge.  Musculoskeletal:  Positive for joint swelling.  Skin:  Negative for color change.  Neurological:  Negative for speech difficulty.  Hematological:  Negative for adenopathy.  Psychiatric/Behavioral:  Positive for dysphoric mood and sleep disturbance. Negative for suicidal ideas. The patient is nervous/anxious.    Objective:   Today's Vitals: BP 130/70   Pulse 70   Ht 5' 1.5" (1.562 m)   Wt 295 lb 3.2 oz (133.9 kg)   BMI 54.87 kg/m   Physical Exam HENT:     Head: Normocephalic.     Nose: Nose normal.     Mouth/Throat:     Mouth: Mucous membranes are moist.  Eyes:     Pupils: Pupils  are equal, round, and reactive to light.  Cardiovascular:     Rate and Rhythm: Normal rate.     Pulses: Normal pulses.  Pulmonary:     Effort: Pulmonary effort is normal.     Breath sounds: No wheezing.  Musculoskeletal:        General: No swelling.     Cervical back: Normal range of motion.  Skin:    Coloration: Skin is not jaundiced.  Neurological:     General: No focal deficit present.     Mental Status: She is alert.  Psychiatric:        Mood and Affect: Mood normal.    Assessment & Plan:   Problem List Items Addressed This Visit       Cardiovascular and Mediastinum   Hypertension     Patient denies any chest pain or shortness of breath there is no history of palpitation or paroxysmal nocturnal dyspnea   patient was advised to follow low-salt low-cholesterol diet    ideally I want to  keep systolic blood pressure below 160 mmHg, patient was asked to check blood pressure one times a week and give me a report on that.  Patient will be follow-up in 3 months  or earlier as needed, patient will call me back for any change in the cardiovascular symptoms Patient was advised to buy a book from local bookstore concerning blood pressure and read several chapters  every day.  This will be supplemented by some of the material we will give him from the office.  Patient should also utilize other resources like YouTube and Internet to learn more about the blood pressure and the diet.       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  - I educated them on making healthy dietary choices including eating more fruits and vegetables and less fried foods. - I encouraged the patient to exercise more, and educated on the benefits of exercise including weight loss, diabetes prevention, and hypertension prevention.   Dietary counseling with a registered dietician  Referral to a weight management support group (e.g. Weight Watchers, Overeaters Anonymous)  If your BMI is greater than 29 or you have gained more than 15 pounds you should work on weight loss.  Attend a healthy cooking class        Idiopathic sleep related nonobstructive alveolar hypoventilation    Patient has a sleep apnea also I suggest she should have a sleep study done sometime.  She was also advised to lose weight.         Musculoskeletal and Integument   Arthritis    Patient has arthritis of the knee and back.  She was advised to lose weight.  We will do a complete blood test on her in the next visit, she was also advised to have mammogram sometime this year she was advised to do the self examination          Other   Anxiety   Midline low back pain without sciatica    Outpatient Encounter Medications as of 09/03/2020  Medication Sig   amLODipine (NORVASC) 5 MG tablet Take 5 mg by mouth daily.   famotidine (PEPCID) 20 MG tablet Take 20 mg by mouth 2 (two) times daily.  hydrochlorothiazide (HYDRODIURIL) 12.5 MG tablet Take 2 tablets (25 mg total) by mouth daily.   [DISCONTINUED] cyclobenzaprine (FLEXERIL) 10 MG tablet Take 1 tablet (10 mg total) by mouth 3 (three) times daily as needed.   [DISCONTINUED] hydrochlorothiazide (HYDRODIURIL) 25 MG tablet Take 1 tablet (25 mg total) by mouth daily.   [DISCONTINUED] phenol (CHLORASEPTIC) 1.4 % LIQD Use as directed 1 spray in the mouth or throat as needed for throat irritation / pain.   No facility-administered encounter medications on file as of 09/03/2020.    Follow-up: No follow-ups on file.   Corky Downs, MD

## 2020-09-03 NOTE — Assessment & Plan Note (Signed)

## 2020-09-03 NOTE — Assessment & Plan Note (Signed)
Patient has a sleep apnea also I suggest she should have a sleep study done sometime.  She was also advised to lose weight.

## 2020-09-03 NOTE — Assessment & Plan Note (Signed)
Patient has arthritis of the knee and back.  She was advised to lose weight.  We will do a complete blood test on her in the next visit, she was also advised to have mammogram sometime this year she was advised to do the self examination

## 2020-09-03 NOTE — Assessment & Plan Note (Signed)

## 2020-09-06 ENCOUNTER — Other Ambulatory Visit: Payer: Self-pay | Admitting: Internal Medicine

## 2020-09-06 ENCOUNTER — Other Ambulatory Visit: Payer: Self-pay | Admitting: *Deleted

## 2020-09-06 DIAGNOSIS — I10 Essential (primary) hypertension: Secondary | ICD-10-CM

## 2020-09-06 DIAGNOSIS — F419 Anxiety disorder, unspecified: Secondary | ICD-10-CM

## 2020-09-07 LAB — CBC WITH DIFFERENTIAL/PLATELET
Basophils Absolute: 0 x10E3/uL (ref 0.0–0.2)
Basos: 1 %
EOS (ABSOLUTE): 0.2 x10E3/uL (ref 0.0–0.4)
Eos: 4 %
Hematocrit: 38.2 % (ref 34.0–46.6)
Hemoglobin: 11.9 g/dL (ref 11.1–15.9)
Immature Grans (Abs): 0 x10E3/uL (ref 0.0–0.1)
Immature Granulocytes: 0 %
Lymphocytes Absolute: 1.8 x10E3/uL (ref 0.7–3.1)
Lymphs: 30 %
MCH: 24.8 pg — ABNORMAL LOW (ref 26.6–33.0)
MCHC: 31.2 g/dL — ABNORMAL LOW (ref 31.5–35.7)
MCV: 80 fL (ref 79–97)
Monocytes Absolute: 0.4 x10E3/uL (ref 0.1–0.9)
Monocytes: 7 %
Neutrophils Absolute: 3.5 x10E3/uL (ref 1.4–7.0)
Neutrophils: 58 %
Platelets: 316 x10E3/uL (ref 150–450)
RBC: 4.79 x10E6/uL (ref 3.77–5.28)
RDW: 15.5 % — ABNORMAL HIGH (ref 11.7–15.4)
WBC: 6 x10E3/uL (ref 3.4–10.8)

## 2020-09-07 LAB — COMPREHENSIVE METABOLIC PANEL
ALT: 10 IU/L (ref 0–32)
AST: 19 IU/L (ref 0–40)
Albumin/Globulin Ratio: 1.5 (ref 1.2–2.2)
Albumin: 3.8 g/dL (ref 3.8–4.8)
Alkaline Phosphatase: 66 IU/L (ref 44–121)
BUN/Creatinine Ratio: 13 (ref 9–23)
BUN: 12 mg/dL (ref 6–24)
Bilirubin Total: 0.3 mg/dL (ref 0.0–1.2)
CO2: 25 mmol/L (ref 20–29)
Calcium: 9.3 mg/dL (ref 8.7–10.2)
Chloride: 101 mmol/L (ref 96–106)
Creatinine, Ser: 0.94 mg/dL (ref 0.57–1.00)
Globulin, Total: 2.6 g/dL (ref 1.5–4.5)
Glucose: 86 mg/dL (ref 65–99)
Potassium: 4.1 mmol/L (ref 3.5–5.2)
Sodium: 138 mmol/L (ref 134–144)
Total Protein: 6.4 g/dL (ref 6.0–8.5)
eGFR: 78 mL/min/{1.73_m2} (ref >59)

## 2020-09-07 LAB — LIPID PANEL W/O CHOL/HDL RATIO
Cholesterol, Total: 211 mg/dL — ABNORMAL HIGH (ref 100–199)
HDL: 45 mg/dL (ref >39)
LDL Chol Calc (NIH): 156 mg/dL — ABNORMAL HIGH (ref 0–99)
Triglycerides: 55 mg/dL (ref 0–149)
VLDL Cholesterol Cal: 10 mg/dL (ref 5–40)

## 2020-09-07 LAB — TSH: TSH: 1.56 u[IU]/mL (ref 0.450–4.500)

## 2020-09-17 ENCOUNTER — Ambulatory Visit (INDEPENDENT_AMBULATORY_CARE_PROVIDER_SITE_OTHER): Payer: No Typology Code available for payment source | Admitting: Internal Medicine

## 2020-09-17 ENCOUNTER — Encounter: Payer: Self-pay | Admitting: Internal Medicine

## 2020-09-17 ENCOUNTER — Other Ambulatory Visit: Payer: Self-pay

## 2020-09-17 VITALS — BP 124/75 | HR 75 | Ht 61.5 in | Wt 300.0 lb

## 2020-09-17 DIAGNOSIS — M545 Low back pain, unspecified: Secondary | ICD-10-CM

## 2020-09-17 DIAGNOSIS — G4734 Idiopathic sleep related nonobstructive alveolar hypoventilation: Secondary | ICD-10-CM | POA: Diagnosis not present

## 2020-09-17 DIAGNOSIS — E662 Morbid (severe) obesity with alveolar hypoventilation: Secondary | ICD-10-CM

## 2020-09-17 DIAGNOSIS — I10 Essential (primary) hypertension: Secondary | ICD-10-CM | POA: Diagnosis not present

## 2020-09-17 DIAGNOSIS — Z6841 Body Mass Index (BMI) 40.0 and over, adult: Secondary | ICD-10-CM

## 2020-09-17 DIAGNOSIS — F419 Anxiety disorder, unspecified: Secondary | ICD-10-CM | POA: Diagnosis not present

## 2020-09-17 NOTE — Progress Notes (Signed)
Established Patient Office Visit  Subjective:  Patient ID: Sharon Jennings, female    DOB: [birth year]  Age: 40 y.o. MRN: [id number]  CC:  Chief Complaint  Patient presents with   Lab Results    HPI  Sharon Jennings presents for sleep problems, review  labs, patient other problem include obesity hypertension low back pain and anxiety.  She also has idiopathic sleep-related nonobstructive alveolar hypoventilation, blood pressure is under control.  Past Medical History:  Diagnosis Date   Anxiety    Arthritis    Depression    Heartburn    High cholesterol    Hypertension    Obesity     Past Surgical History:  Procedure Laterality Date   CESAREAN SECTION  2005    Family History  Problem Relation Age of Onset   Hypertension Mother    Cancer Father    Cancer Paternal Grandmother    Diabetes Paternal Grandfather     Social History   Socioeconomic History   Marital status: Single    Spouse name: Not on file   Number of children: Not on file   Years of education: Not on file   Highest education level: Not on file  Occupational History   Not on file  Tobacco Use   Smoking status: Former    Types: Cigarettes    Quit date: 02/21/2014    Years since quitting: 6.5   Smokeless tobacco: Never  Substance and Sexual Activity   Alcohol use: Yes   Drug use: No   Sexual activity: Yes  Other Topics Concern   Not on file  Social History Narrative   Not on file   Social Determinants of Health   Financial Resource Strain: Not on file  Food Insecurity: Not on file  Transportation Needs: Not on file  Physical Activity: Not on file  Stress: Not on file  Social Connections: Not on file  Intimate Partner Violence: Not on file     Current Outpatient Medications:    amLODipine (NORVASC) 5 MG tablet, Take 5 mg by mouth daily., Disp: , Rfl:    famotidine (PEPCID) 20 MG tablet, Take 20 mg by mouth 2 (two) times daily., Disp: , Rfl:    hydrochlorothiazide (HYDRODIURIL) 12.5 MG  tablet, Take 2 tablets (25 mg total) by mouth daily., Disp: 30 tablet, Rfl: 0   Allergies  Allergen Reactions   Ranitidine     PT reports shaking and heart palpitations   Nexium [Esomeprazole Magnesium] Anxiety    Pt reports shaking and palpitations.     ROS Review of Systems  Constitutional: Negative.   HENT: Negative.    Eyes: Negative.   Respiratory: Negative.    Cardiovascular: Negative.   Gastrointestinal: Negative.   Endocrine: Negative.   Genitourinary: Negative.   Musculoskeletal: Negative.   Skin: Negative.   Allergic/Immunologic: Negative.   Neurological: Negative.   Hematological: Negative.   Psychiatric/Behavioral: Negative.    All other systems reviewed and are negative.    Objective:    Physical Exam Vitals reviewed.  Constitutional:      Appearance: Normal appearance.  HENT:     Mouth/Throat:     Mouth: Mucous membranes are moist.  Eyes:     Pupils: Pupils are equal, round, and reactive to light.  Neck:     Vascular: No carotid bruit.  Cardiovascular:     Rate and Rhythm: Normal rate and regular rhythm.     Pulses: Normal pulses.     Heart sounds: Normal heart  sounds.  Pulmonary:     Effort: Pulmonary effort is normal.     Breath sounds: Normal breath sounds.  Abdominal:     General: Bowel sounds are normal.     Palpations: Abdomen is soft. There is no hepatomegaly, splenomegaly or mass.     Tenderness: There is no abdominal tenderness.     Hernia: No hernia is present.  Musculoskeletal:        General: No tenderness.     Cervical back: Neck supple.     Right lower leg: No edema.     Left lower leg: No edema.  Skin:    Findings: No rash.  Neurological:     Mental Status: She is alert and oriented to person, place, and time.     Motor: No weakness.  Psychiatric:        Mood and Affect: Mood and affect normal.        Behavior: Behavior normal.    BP 124/75   Pulse 75   Ht 5' 1.5" (1.562 m)   Wt 300 lb (136.1 kg)   BMI 55.77 kg/m   Wt Readings from Last 3 Encounters:  09/17/20 300 lb (136.1 kg)  09/03/20 295 lb 3.2 oz (133.9 kg)  07/15/20 286 lb 9.6 oz (130 kg)     Health Maintenance Due  Topic Date Due   HIV Screening  Never done   Hepatitis C Screening  Never done   TETANUS/TDAP  Never done   PAP SMEAR-Modifier  Never done   COVID-19 Vaccine (3 - Booster for Pfizer series) 03/18/2020   INFLUENZA VACCINE  09/16/2020    There are no preventive care reminders to display for this patient.  Lab Results  Component Value Date   TSH 1.560 09/06/2020   Lab Results  Component Value Date   WBC 6.0 09/06/2020   HGB 11.9 09/06/2020   HCT 38.2 09/06/2020   MCV 80 09/06/2020   PLT 316 09/06/2020   Lab Results  Component Value Date   NA 138 09/06/2020   K 4.1 09/06/2020   CO2 25 09/06/2020   GLUCOSE 86 09/06/2020   BUN 12 09/06/2020   CREATININE 0.94 09/06/2020   BILITOT 0.3 09/06/2020   ALKPHOS 66 09/06/2020   AST 19 09/06/2020   ALT 10 09/06/2020   PROT 6.4 09/06/2020   ALBUMIN 3.8 09/06/2020   CALCIUM 9.3 09/06/2020   ANIONGAP 7 05/13/2018   EGFR 78 09/06/2020   Lab Results  Component Value Date   CHOL 211 (H) 09/06/2020   Lab Results  Component Value Date   HDL 45 09/06/2020   Lab Results  Component Value Date   LDLCALC 156 (H) 09/06/2020   Lab Results  Component Value Date   TRIG 55 09/06/2020   No results found for: CHOLHDL No results found for: HGBA1C    Assessment & Plan:   Problem List Items Addressed This Visit       Cardiovascular and Mediastinum   Hypertension - Primary     Patient denies any chest pain or shortness of breath there is no history of palpitation or paroxysmal nocturnal dyspnea   patient was advised to follow low-salt low-cholesterol diet    ideally I want to keep systolic blood pressure below 130 mmHg, patient was asked to check blood pressure one times a week and give me a report on that.  Patient will be follow-up in 3 months  or earlier as needed,  patient will call me back for any change  in the cardiovascular symptoms Patient was advised to buy a book from local bookstore concerning blood pressure and read several chapters  every day.  This will be supplemented by some of the material we will give him from the office.  Patient should also utilize other resources like YouTube and Internet to learn more about the blood pressure and the diet.         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  - I educated them on making healthy dietary choices including eating more fruits and vegetables and less fried foods. - I encouraged the patient to exercise more, and educated on the benefits of exercise including weight loss, diabetes prevention, and hypertension prevention.   Dietary counseling with a registered dietician  Referral to a weight management support group (e.g. Weight Watchers, Overeaters Anonymous)  If your BMI is greater than 29 or you have gained more than 15 pounds you should work on weight loss.  Attend a healthy cooking class        Idiopathic sleep related nonobstructive alveolar hypoventilation     Other   Anxiety    Patient was advised to use a book on anxiety, how to start living without worrying       Midline low back pain without sciatica  Cholesterol is elevated patient was started on following diet  Hypercholesterolemia  I advised the patient to follow Mediterranean diet This diet is rich in fruits vegetables and whole grain, and This diet is also rich in fish and lean meat Patient should also eat a handful of almonds or walnuts daily Recent heart study indicated that average follow-up on this kind of diet reduces the cardiovascular mortality by 50 to 70%==  No orders of the defined types were placed in this encounter.   Follow-up: No follow-ups on file.     Cletis Athens, MD

## 2020-09-19 NOTE — Assessment & Plan Note (Signed)

## 2020-09-19 NOTE — Assessment & Plan Note (Signed)

## 2020-09-19 NOTE — Assessment & Plan Note (Signed)
Patient was advised to use a book on anxiety, how to start living without worrying

## 2021-01-16 ENCOUNTER — Other Ambulatory Visit: Payer: Self-pay | Admitting: Internal Medicine

## 2021-03-28 ENCOUNTER — Emergency Department: Payer: No Typology Code available for payment source

## 2021-03-28 ENCOUNTER — Encounter: Payer: Self-pay | Admitting: Emergency Medicine

## 2021-03-28 ENCOUNTER — Other Ambulatory Visit: Payer: Self-pay

## 2021-03-28 ENCOUNTER — Emergency Department
Admission: EM | Admit: 2021-03-28 | Discharge: 2021-03-28 | Disposition: A | Discharge status: Home / Self Care | Payer: No Typology Code available for payment source | Attending: Emergency Medicine | Admitting: Emergency Medicine

## 2021-03-28 DIAGNOSIS — M1712 Unilateral primary osteoarthritis, left knee: Secondary | ICD-10-CM | POA: Diagnosis not present

## 2021-03-28 DIAGNOSIS — I10 Essential (primary) hypertension: Secondary | ICD-10-CM | POA: Diagnosis not present

## 2021-03-28 DIAGNOSIS — M25562 Pain in left knee: Secondary | ICD-10-CM | POA: Diagnosis present

## 2021-03-28 MED ORDER — MELOXICAM 15 MG PO TABS: 15.0000 mg | ORAL_TABLET | Freq: Every day | ORAL | 1 refills | Status: AC

## 2021-03-28 NOTE — Discharge Instructions (Signed)
Call make an appoint with Dr. Okey Dupre who is the orthopedist on-call.  He has contact information and address is listed on your discharge papers.  Begin taking meloxicam 15 mg 1 daily with food.  You may continue with your patches to your knee as needed for pain.  Epsom salt, ice or heat can be used to your knee as well.

## 2021-03-28 NOTE — ED Triage Notes (Signed)
Pt to ED via POV c/o left knee pain and stiffness. Pt denies recent injury but states that she did slip about 2 months ago. Pt is in NAD.

## 2021-03-28 NOTE — ED Provider Notes (Signed)
[facility] Provider Note    Event Date/Time   First MD Initiated Contact with Patient 03/28/21 [phone]     (approximate)   History   Knee Pain   HPI  Sharon Jennings is a 41 y.o. female presents to the ED with complaint of left knee pain for 2 weeks.  Denies any recent injury.  She states that she did slip approximately 2 months ago but did not fall.  Patient has been taking ibuprofen, using pain patches and doing Epsom salt without any relief.  Patient rates her pain as an 8 out of 10.     Physical Exam   Triage Vital Signs: ED Triage Vitals  Enc Vitals Group     BP 03/28/21 0726 (!) 130/98     Pulse Rate 03/28/21 0725 75     Resp 03/28/21 0725 18     Temp 03/28/21 0725 98.7 F (37.1 C)     Temp Source 03/28/21 0725 Oral     SpO2 03/28/21 0725 97 %     Weight 03/28/21 0726 (!) 312 lb (141.5 kg)     Height 03/28/21 0726 5' 1.5" (1.562 m)     Head Circumference --      Peak Flow --      Pain Score 03/28/21 0722 8     Pain Loc --      Pain Edu? --      Excl. in GC? --     Most recent vital signs: Vitals:   03/28/21 0726 03/28/21 0839  BP: (!) 130/98 (!) 130/98  Pulse:  75  Resp:  18  Temp:  98.7 F (37.1 C)  SpO2:  97%     General: Awake, no distress.  BMI 58.0 CV:  Good peripheral perfusion.  Heart regular rate and rhythm without murmur. Resp:  Normal effort.  Clear bilaterally. Abd:  No distention.  Other:  Left knee without gross deformity.  No effusion present.  Moderate crepitus with range of motion.  No erythema or warmth noted.  No calf pain, Homans' sign negative, skin intact without discoloration or abrasions.   ED Results / Procedures / Treatments   Labs (all labs ordered are listed, but only abnormal results are displayed) Labs Reviewed - No data to display    RADIOLOGY  X-rays of the left knee were reviewed by myself and noted to have degenerative joint disease.  Radiology report states moderate to severe  degenerative disease.   PROCEDURES:  Critical Care performed:   Procedures   MEDICATIONS ORDERED IN ED: Medications - No data to display   IMPRESSION / MDM / ASSESSMENT AND PLAN / ED COURSE  I reviewed the triage vital signs and the nursing notes.   Differential diagnosis includes, but is not limited to, degenerative joint disease, patella tendinitis, bursitis, acute gout, and ligament strain.  41 year old female presents to the ED with complaint of left knee pain for approximately 2 weeks without known injury.  Patient states that she slipped but did not fall on her knee.  Patient has a history of hypercholesterolemia, hypertension, depression, anxiety, arthritis, obesity and low back pain.  She has taken over-the-counter medications without complete relief of her knee pain.  X-rays show moderate to severe degenerative changes progressed from 2014.  Patient was made aware.  She was given instructions to follow-up with her PCP or Dr. Okey Dupre who is the orthopedist on-call.  His information was given to her.  Patient was discharged with prescription for meloxicam  15 mg 1 daily with food.  She may continue with her over-the-counter pain patches, Epsom salt and ice packs as needed.    FINAL CLINICAL IMPRESSION(S) / ED DIAGNOSES   Final diagnoses:  Osteoarthritis of left knee, unspecified osteoarthritis type     Rx / DC Orders   ED Discharge Orders          Ordered    meloxicam (MOBIC) 15 MG tablet  Daily        03/28/21 0830             Note:  This document was prepared using Dragon voice recognition software and may include unintentional dictation errors.   Tommi Rumps, PA-C 03/28/21 [id number]    Jene Every, MD 03/28/21 [phone]

## 2021-04-23 NOTE — Progress Notes (Signed)
? ?Established Patient Office Visit ? ?Subjective:  ?Patient ID: Sharon Jennings, female    DOB: 13-Jul-1980  Age: 41 y.o. MRN: [id number] ? ?CC:  ?Chief Complaint  ?Patient presents with  ? ER Follow up  ?  Patient went to the ER on Thursday on March 2nd for cramping in both legs and abdomen. Patient also complains of back. At the ED patient was told her potassium was low.  ? ? ? ?HPI ?The cramps are better now. The cramps happens mostly when she is sleeping. It mosltly happens in the right thigh, calf, feet and abdomen. Patient has a family history of Rheumatoid arthritis and sickle cell traits. Her labs from the ER  shows she is anemic.  ? ? ? ?HPI  ? ?Past Medical History:  ?Diagnosis Date  ? Anxiety   ? Arthritis   ? Depression   ? Heartburn   ? High cholesterol   ? Hypertension   ? Obesity   ? ? ?Past Surgical History:  ?Procedure Laterality Date  ? CESAREAN SECTION  2005  ? ? ?Family History  ?Problem Relation Age of Onset  ? Hypertension Mother   ? Cancer Father   ? Cancer Paternal Grandmother   ? Diabetes Paternal Grandfather   ? ? ?Social History  ? ?Socioeconomic History  ? Marital status: Single  ?  Spouse name: Not on file  ? Number of children: Not on file  ? Years of education: Not on file  ? Highest education level: Not on file  ?Occupational History  ? Not on file  ?Tobacco Use  ? Smoking status: Former  ?  Types: Cigarettes  ?  Quit date: 02/21/2014  ?  Years since quitting: 7.1  ? Smokeless tobacco: Never  ?Substance and Sexual Activity  ? Alcohol use: Yes  ? Drug use: No  ? Sexual activity: Yes  ?Other Topics Concern  ? Not on file  ?Social History Narrative  ? Not on file  ? ?Social Determinants of Health  ? ?Financial Resource Strain: Not on file  ?Food Insecurity: Not on file  ?Transportation Needs: Not on file  ?Physical Activity: Not on file  ?Stress: Not on file  ?Social Connections: Not on file  ?Intimate Partner Violence: Not on file  ? ? ? ?Outpatient Medications Prior to Visit  ?Medication  Sig Dispense Refill  ? amLODipine (NORVASC) 5 MG tablet TAKE 1 TABLET BY MOUTH EVERY DAY 90 tablet 3  ? famotidine (PEPCID) 20 MG tablet Take 20 mg by mouth 2 (two) times daily.    ? hydrochlorothiazide (HYDRODIURIL) 12.5 MG tablet Take 2 tablets (25 mg total) by mouth daily. 30 tablet 0  ? meloxicam (MOBIC) 15 MG tablet Take 1 tablet (15 mg total) by mouth daily. 30 tablet 1  ? ?No facility-administered medications prior to visit.  ? ? ?Allergies  ?Allergen Reactions  ? Ranitidine   ?  PT reports shaking and heart palpitations  ? Nexium [Esomeprazole Magnesium] Anxiety  ?  Pt reports shaking and palpitations.   ? ? ?ROS ?Review of Systems  ?Constitutional: Negative.   ?HENT: Negative.    ?Eyes: Negative.   ?Respiratory:  Negative for cough, chest tightness and shortness of breath.   ?Cardiovascular:  Negative for chest pain and palpitations.  ?Gastrointestinal: Negative.   ?Endocrine: Negative.   ?Genitourinary: Negative.   ?Musculoskeletal:  Positive for back pain.  ?Skin: Negative.   ?Neurological:  Negative for dizziness, light-headedness and headaches.  ?Psychiatric/Behavioral:  Negative for agitation, behavioral  problems and confusion.   ? ?  ?Objective:  ?  ?Physical Exam ?Constitutional:   ?   Appearance: Normal appearance. She is obese.  ?HENT:  ?   Head: Normocephalic.  ?   Right Ear: Tympanic membrane normal.  ?   Left Ear: Tympanic membrane normal.  ?   Nose: Nose normal.  ?   Mouth/Throat:  ?   Mouth: Mucous membranes are moist.  ?Eyes:  ?   Conjunctiva/sclera: Conjunctivae normal.  ?   Pupils: Pupils are equal, round, and reactive to light.  ?Cardiovascular:  ?   Rate and Rhythm: Normal rate and regular rhythm.  ?   Heart sounds: No murmur heard. ?Pulmonary:  ?   Effort: Pulmonary effort is normal. No respiratory distress.  ?   Breath sounds: Normal breath sounds.  ?Abdominal:  ?   General: Bowel sounds are normal.  ?   Palpations: Abdomen is soft.  ?Musculoskeletal:     ?   General: Normal range of  motion.  ?Skin: ?   General: Skin is warm.  ?   Capillary Refill: Capillary refill takes less than 2 seconds.  ?Neurological:  ?   General: No focal deficit present.  ?   Mental Status: She is alert and oriented to person, place, and time. Mental status is at baseline.  ?Psychiatric:     ?   Mood and Affect: Mood normal.     ?   Behavior: Behavior normal.     ?   Thought Content: Thought content normal.     ?   Judgment: Judgment normal.  ? ? ?BP (!) 149/96   Pulse 76   Ht 5' 1.5" (1.562 m)   Wt (!) 317 lb 9.6 oz (144.1 kg)   BMI 59.04 kg/m?  ?Wt Readings from Last 3 Encounters:  ?04/24/21 (!) 317 lb 9.6 oz (144.1 kg)  ?03/28/21 (!) 312 lb (141.5 kg)  ?09/17/20 300 lb (136.1 kg)  ? ? ? ?Health Maintenance Due  ?Topic Date Due  ? HIV Screening  Never done  ? Hepatitis C Screening  Never done  ? TETANUS/TDAP  Never done  ? PAP SMEAR-Modifier  Never done  ? COVID-19 Vaccine (3 - Booster for Pfizer series) 12/12/2019  ? ? ?There are no preventive care reminders to display for this patient. ? ?Lab Results  ?Component Value Date  ? TSH 2.51 04/24/2021  ? ?Lab Results  ?Component Value Date  ? WBC 6.0 09/06/2020  ? HGB 11.9 09/06/2020  ? HCT 38.2 09/06/2020  ? MCV 80 09/06/2020  ? PLT 316 09/06/2020  ? ?Lab Results  ?Component Value Date  ? NA 138 04/24/2021  ? K 4.7 04/24/2021  ? CO2 25 04/24/2021  ? GLUCOSE 102 (H) 04/24/2021  ? BUN 14 04/24/2021  ? CREATININE 1.08 (H) 04/24/2021  ? BILITOT 0.3 04/24/2021  ? ALKPHOS 66 09/06/2020  ? AST 24 04/24/2021  ? ALT 16 04/24/2021  ? PROT 6.9 04/24/2021  ? ALBUMIN 3.8 09/06/2020  ? CALCIUM 9.1 04/24/2021  ? ANIONGAP 7 05/13/2018  ? EGFR 66 04/24/2021  ? ?Lab Results  ?Component Value Date  ? CHOL 211 (H) 09/06/2020  ? ?Lab Results  ?Component Value Date  ? HDL 45 09/06/2020  ? ?Lab Results  ?Component Value Date  ? LDLCALC 156 (H) 09/06/2020  ? ?Lab Results  ?Component Value Date  ? TRIG 55 09/06/2020  ? ?No results found for: CHOLHDL ?No results found for: HGBA1C ? ?   ?Assessment &  Plan:  ? ?Problem List Items Addressed This Visit   ? ?  ? Cardiovascular and Mediastinum  ? Hypertension  ?  BP 149/96 ?Pt blood pressure not controlled. ?Advise pt to limit the intake of sodium. ?Advise pt to increase the intake of water and  perform regular exercise  ?Advise pt to measure the BP at home and bring it to the next appointment.  ? ? ? ?  ?  ?  ? Respiratory  ? 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  BMI 59.04 ?Advised pt to lose weight. ?Advised patient to avoid trans fat, fatty and fried food. ?Follow a regular physical activity schedule. ?Went over the risk of chronic diseases with increased weight.  ?Referral sent to [facility] wellness center.    ? ? ? ?  ?  ?  ? Other  ? Midline low back pain without sciatica  ? Relevant Orders  ? POCT urinalysis dipstick (Completed)  ? Muscle cramps - Primary  ?  Repeat CK labs,  Met C and urinalysis.  ? ? ?  ?  ? Relevant Orders  ? COMPLETE METABOLIC PANEL WITH GFR (Completed)  ? TSH (Completed)  ? CK (Creatine Kinase) (Completed)  ? Anemia  ?  Iron, TIBC and Ferritin panel labs ordered. ?  ?  ? Relevant Orders  ? Iron, TIBC and Ferritin Panel (Completed)  ? ?Other Visit Diagnoses   ? ? Encounter for weight management      ? Relevant Orders  ? Ambulatory Referral to [facility]  ? ?  ? ? ? ?No orders of the defined types were placed in this encounter. ? ? ? ?Follow-up: No follow-ups on file.  ? ? ?Theresia Lo, NP ?

## 2021-04-24 ENCOUNTER — Ambulatory Visit (INDEPENDENT_AMBULATORY_CARE_PROVIDER_SITE_OTHER): Payer: No Typology Code available for payment source | Admitting: Nurse Practitioner

## 2021-04-24 VITALS — BP 149/96 | HR 76 | Ht 61.5 in | Wt 317.6 lb

## 2021-04-24 DIAGNOSIS — Z7689 Persons encountering health services in other specified circumstances: Secondary | ICD-10-CM | POA: Diagnosis not present

## 2021-04-24 DIAGNOSIS — M545 Low back pain, unspecified: Secondary | ICD-10-CM | POA: Diagnosis not present

## 2021-04-24 DIAGNOSIS — R252 Cramp and spasm: Secondary | ICD-10-CM | POA: Diagnosis not present

## 2021-04-24 DIAGNOSIS — D649 Anemia, unspecified: Secondary | ICD-10-CM | POA: Diagnosis not present

## 2021-04-24 DIAGNOSIS — I10 Essential (primary) hypertension: Secondary | ICD-10-CM

## 2021-04-24 DIAGNOSIS — Z6841 Body Mass Index (BMI) 40.0 and over, adult: Secondary | ICD-10-CM

## 2021-04-24 DIAGNOSIS — E662 Morbid (severe) obesity with alveolar hypoventilation: Secondary | ICD-10-CM

## 2021-04-24 LAB — POCT URINALYSIS DIPSTICK
Bilirubin, UA: NEGATIVE
Blood, UA: NEGATIVE
Glucose, UA: NEGATIVE
Ketones, UA: NEGATIVE
Leukocytes, UA: NEGATIVE
Nitrite, UA: NEGATIVE
Protein, UA: NEGATIVE
Spec Grav, UA: 1.015 (ref 1.010–1.025)
Urobilinogen, UA: 0.2 E.U./dL
pH, UA: 6 (ref 5.0–8.0)

## 2021-04-25 LAB — COMPLETE METABOLIC PANEL WITH GFR
AG Ratio: 1.3 (calc) (ref 1.0–2.5)
ALT: 16 U/L (ref 6–29)
AST: 24 U/L (ref 10–30)
Albumin: 3.9 g/dL (ref 3.6–5.1)
Alkaline phosphatase (APISO): 59 U/L (ref 31–125)
BUN/Creatinine Ratio: 13 (calc) (ref 6–22)
BUN: 14 mg/dL (ref 7–25)
CO2: 25 mmol/L (ref 20–32)
Calcium: 9.1 mg/dL (ref 8.6–10.2)
Chloride: 104 mmol/L (ref 98–110)
Creat: 1.08 mg/dL — ABNORMAL HIGH (ref 0.50–0.99)
Globulin: 3 g/dL (calc) (ref 1.9–3.7)
Glucose, Bld: 102 mg/dL — ABNORMAL HIGH (ref 65–99)
Potassium: 4.7 mmol/L (ref 3.5–5.3)
Sodium: 138 mmol/L (ref 135–146)
Total Bilirubin: 0.3 mg/dL (ref 0.2–1.2)
Total Protein: 6.9 g/dL (ref 6.1–8.1)
eGFR: 66 mL/min/{1.73_m2} (ref >=60)

## 2021-04-25 LAB — IRON,TIBC AND FERRITIN PANEL
%SAT: 7 % (calc) — ABNORMAL LOW (ref 16–45)
Ferritin: 31 ng/mL (ref 16–232)
Iron: 28 ug/dL — ABNORMAL LOW (ref 40–190)
TIBC: 391 mcg/dL (calc) (ref 250–450)

## 2021-04-25 LAB — TSH: TSH: 2.51 mIU/L

## 2021-04-25 LAB — EXTRA LAV TOP TUBE

## 2021-04-25 LAB — CK: Total CK: 340 U/L — ABNORMAL HIGH (ref 29–143)

## 2021-04-27 ENCOUNTER — Encounter: Payer: Self-pay | Admitting: Nurse Practitioner

## 2021-04-27 NOTE — Assessment & Plan Note (Addendum)
BMI 59.04 ?Advised pt to lose weight. ?Advised patient to avoid trans fat, fatty and fried food. ?Follow a regular physical activity schedule. ?Went over the risk of chronic diseases with increased weight.  ?Referral sent to [facility] wellness center.    ? ? ? ?

## 2021-04-27 NOTE — Assessment & Plan Note (Signed)
BP 149/96 ?Pt blood pressure not controlled. ?Advise pt to limit the intake of sodium. ?Advise pt to increase the intake of water and  perform regular exercise  ?Advise pt to measure the BP at home and bring it to the next appointment.  ? ? ? ?

## 2021-04-27 NOTE — Assessment & Plan Note (Signed)
Iron, TIBC and Ferritin panel labs ordered. ?

## 2021-04-27 NOTE — Assessment & Plan Note (Signed)
Repeat CK labs,  Met C and urinalysis.  ? ? ?

## 2021-05-02 ENCOUNTER — Other Ambulatory Visit (INDEPENDENT_AMBULATORY_CARE_PROVIDER_SITE_OTHER): Payer: No Typology Code available for payment source

## 2021-05-02 ENCOUNTER — Other Ambulatory Visit: Payer: Self-pay

## 2021-05-02 DIAGNOSIS — R252 Cramp and spasm: Secondary | ICD-10-CM

## 2021-05-03 LAB — CK: Total CK: 195 U/L — ABNORMAL HIGH (ref 29–143)

## 2021-07-15 ENCOUNTER — Ambulatory Visit (INDEPENDENT_AMBULATORY_CARE_PROVIDER_SITE_OTHER): Payer: No Typology Code available for payment source | Admitting: Internal Medicine

## 2021-07-15 ENCOUNTER — Encounter: Payer: Self-pay | Admitting: Internal Medicine

## 2021-07-15 VITALS — BP 147/87 | HR 73 | Ht 61.5 in | Wt 318.2 lb

## 2021-07-15 DIAGNOSIS — G4734 Idiopathic sleep related nonobstructive alveolar hypoventilation: Secondary | ICD-10-CM

## 2021-07-15 DIAGNOSIS — Z6841 Body Mass Index (BMI) 40.0 and over, adult: Secondary | ICD-10-CM

## 2021-07-15 DIAGNOSIS — I10 Essential (primary) hypertension: Secondary | ICD-10-CM | POA: Diagnosis not present

## 2021-07-15 DIAGNOSIS — E662 Morbid (severe) obesity with alveolar hypoventilation: Secondary | ICD-10-CM | POA: Diagnosis not present

## 2021-07-15 DIAGNOSIS — M545 Low back pain, unspecified: Secondary | ICD-10-CM

## 2021-07-15 DIAGNOSIS — F419 Anxiety disorder, unspecified: Secondary | ICD-10-CM | POA: Diagnosis not present

## 2021-07-15 MED ORDER — FAMOTIDINE 20 MG PO TABS: 20.0000 mg | ORAL_TABLET | Freq: Two times a day (BID) | ORAL | 4 refills | Status: DC

## 2021-07-15 NOTE — Assessment & Plan Note (Signed)
Refer to sleep study 

## 2021-07-15 NOTE — Assessment & Plan Note (Signed)
Breast cancer screening is a way in which doctors check the breasts for early signs of cancer in people who have no symptoms of breast cancer. The main test used to screen for breast cancer is a special kind of X-ray called a "mammogram."  The goal of breast cancer screening is to find cancer early, before it has a chance to grow, spread, or cause problems. Studies show that being screened for breast cancer lowers your chance of dying of the disease. Patient is referred to [facility] health for bariatric surgery

## 2021-07-15 NOTE — Assessment & Plan Note (Signed)

## 2021-07-15 NOTE — Progress Notes (Signed)
Established Patient Office Visit  Subjective:  Patient ID: Sharon Jennings, female    DOB: 15-Dec-1980  Age: 41 y.o. MRN: [id number]  CC:  Chief Complaint  Patient presents with   Hypertension   Obesity    Patient would like to discuss having bariatric surgery.     Hypertension This is a chronic problem. The current episode started more than 1 year ago. The problem has been gradually improving since onset. Associated symptoms include anxiety, chest pain and malaise/fatigue. Pertinent negatives include no orthopnea, palpitations, peripheral edema, shortness of breath or sweats. Risk factors for coronary artery disease include obesity and dyslipidemia. Past treatments include calcium channel blockers. The current treatment provides moderate improvement. There is no history of kidney disease. Identifiable causes of hypertension include sleep apnea.  Seizures  Associated symptoms include chest pain.   Sharon Jennings presents for check up   Past Surgical History:  Procedure Laterality Date   CESAREAN SECTION  2005    Family History  Problem Relation Age of Onset   Hypertension Mother    Cancer Father    Cancer Paternal Grandmother    Diabetes Paternal Grandfather     Social History   Socioeconomic History   Marital status: Single    Spouse name: Not on file   Number of children: Not on file   Years of education: Not on file   Highest education level: Not on file  Occupational History   Not on file  Tobacco Use   Smoking status: Former    Types: Cigarettes    Quit date: 02/21/2014    Years since quitting: 7.4   Smokeless tobacco: Never  Substance and Sexual Activity   Alcohol use: Yes   Drug use: No   Sexual activity: Yes  Other Topics Concern   Not on file  Social History Narrative   Not on file   Social Determinants of Health   Financial Resource Strain: Not on file  Food Insecurity: Not on file  Transportation Needs: Not on file  Physical Activity: Not on file   Stress: Not on file  Social Connections: Not on file  Intimate Partner Violence: Not on file     Current Outpatient Medications:    amLODipine (NORVASC) 5 MG tablet, TAKE 1 TABLET BY MOUTH EVERY DAY, Disp: 90 tablet, Rfl: 3   hydrochlorothiazide (HYDRODIURIL) 12.5 MG tablet, Take 2 tablets (25 mg total) by mouth daily., Disp: 30 tablet, Rfl: 0   meloxicam (MOBIC) 15 MG tablet, Take 1 tablet (15 mg total) by mouth daily., Disp: 30 tablet, Rfl: 1   famotidine (PEPCID) 20 MG tablet, Take 1 tablet (20 mg total) by mouth 2 (two) times daily., Disp: 60 tablet, Rfl: 4   Allergies  Allergen Reactions   Ranitidine     PT reports shaking and heart palpitations   Nexium [Esomeprazole Magnesium] Anxiety    Pt reports shaking and palpitations.     ROS Review of Systems  Constitutional:  Positive for malaise/fatigue.  Respiratory:  Negative for shortness of breath.   Cardiovascular:  Positive for chest pain. Negative for palpitations and orthopnea.  Neurological:  Positive for seizures.     Objective:    Physical Exam Vitals reviewed.  Constitutional:      Appearance: She is obese.  HENT:     Head: Atraumatic.     Mouth/Throat:     Mouth: Mucous membranes are moist.  Eyes:     Pupils: Pupils are equal, round, and reactive to light.  Cardiovascular:     Rate and Rhythm: Normal rate.  Pulmonary:     Effort: Pulmonary effort is normal.     Breath sounds: No wheezing or rales.  Abdominal:     General: There is distension.  Musculoskeletal:        General: Normal range of motion.     Cervical back: Normal range of motion.  Skin:    General: Skin is warm.  Neurological:     General: No focal deficit present.  Psychiatric:        Mood and Affect: Mood normal.    BP (!) 147/87   Pulse 73   Ht 5' 1.5" (1.562 m)   Wt (!) 318 lb 3.2 oz (144.3 kg)   BMI 59.15 kg/m  Wt Readings from Last 3 Encounters:  07/15/21 (!) 318 lb 3.2 oz (144.3 kg)  04/24/21 (!) 317 lb 9.6 oz (144.1  kg)  03/28/21 (!) 312 lb (141.5 kg)     Health Maintenance Due  Topic Date Due   HIV Screening  Never done   Hepatitis C Screening  Never done   TETANUS/TDAP  Never done   PAP SMEAR-Modifier  Never done   COVID-19 Vaccine (3 - Booster for Pfizer series) 12/12/2019    There are no preventive care reminders to display for this patient.  Lab Results  Component Value Date   TSH 2.51 04/24/2021   Lab Results  Component Value Date   WBC 6.0 09/06/2020   HGB 11.9 09/06/2020   HCT 38.2 09/06/2020   MCV 80 09/06/2020   PLT 316 09/06/2020   Lab Results  Component Value Date   NA 138 04/24/2021   K 4.7 04/24/2021   CO2 25 04/24/2021   GLUCOSE 102 (H) 04/24/2021   BUN 14 04/24/2021   CREATININE 1.08 (H) 04/24/2021   BILITOT 0.3 04/24/2021   ALKPHOS 66 09/06/2020   AST 24 04/24/2021   ALT 16 04/24/2021   PROT 6.9 04/24/2021   ALBUMIN 3.8 09/06/2020   CALCIUM 9.1 04/24/2021   ANIONGAP 7 05/13/2018   EGFR 66 04/24/2021   Lab Results  Component Value Date   CHOL 211 (H) 09/06/2020   Lab Results  Component Value Date   HDL 45 09/06/2020   Lab Results  Component Value Date   LDLCALC 156 (H) 09/06/2020   Lab Results  Component Value Date   TRIG 55 09/06/2020   No results found for: CHOLHDL No results found for: HGBA1C    Assessment & Plan:   Problem List Items Addressed This Visit       Cardiovascular and Mediastinum   Hypertension - Primary     Patient denies any chest pain or shortness of breath there is no history of palpitation or paroxysmal nocturnal dyspnea   patient was advised to follow low-salt low-cholesterol diet    ideally I want to keep systolic blood pressure below 130 mmHg, patient was asked to check blood pressure one times a week and give me a report on that.  Patient will be follow-up in 3 months  or earlier as needed, patient will call me back for any change in the cardiovascular symptoms Patient was advised to buy a book from local  bookstore concerning blood pressure and read several chapters  every day.  This will be supplemented by some of the material we will give him from the office.  Patient should also utilize other resources like YouTube and Internet to learn more about the blood pressure and the  diet.         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 The main test used to screen for breast cancer is a special kind of X-ray called a "mammogram."  The goal of breast cancer screening is to find cancer early, before it has a chance to grow, spread, or cause problems. Studies show that being screened for breast cancer lowers your chance of dying of the disease. Patient is referred to [facility] health for bariatric surgery        Idiopathic sleep related nonobstructive alveolar hypoventilation    Refer to sleep study         Other   Anxiety     Keeping a stress/anxiety diary. This can help you learn what triggers your reaction and then learn ways to manage your response.  Thinking about how you react to certain situations. You may not be able to control everything, but you can control your response.  Making time for activities that help you relax and not feeling guilty about spending your time in this way.  Visual imagery and yoga can help you stay calm and relax.       Midline low back pain without sciatica    - Patient's back pain is under control with medication.  - Encouraged the patient to stretch or do yoga as able to help with back pain        Meds ordered this encounter  Medications   famotidine (PEPCID) 20 MG tablet    Sig: Take 1 tablet (20 mg total) by mouth 2 (two) times daily.    Dispense:  60 tablet    Refill:  4    Follow-up: No  follow-ups on file.    Cletis Athens, MD

## 2021-07-15 NOTE — Assessment & Plan Note (Signed)
   Keeping a stress/anxiety diary. This can help you learn what triggers your reaction and then learn ways to manage your response.  Thinking about how you react to certain situations. You may not be able to control everything, but you can control your response.  Making time for activities that help you relax and not feeling guilty about spending your time in this way.  Visual imagery and yoga can help you stay calm and relax. 

## 2021-07-15 NOTE — Assessment & Plan Note (Signed)
-   Patient's back pain is under control with medication.  - Encouraged the patient to stretch or do yoga as able to help with back pain 

## 2021-07-22 NOTE — Addendum Note (Signed)
Addended by: Melody Comas L on: 07/22/2021 11:05 AM   Modules accepted: Orders

## 2021-08-20 ENCOUNTER — Other Ambulatory Visit: Payer: Self-pay

## 2021-09-10 ENCOUNTER — Encounter: Payer: Self-pay | Admitting: Internal Medicine

## 2021-09-10 ENCOUNTER — Ambulatory Visit (INDEPENDENT_AMBULATORY_CARE_PROVIDER_SITE_OTHER): Payer: No Typology Code available for payment source | Admitting: Internal Medicine

## 2021-09-10 VITALS — BP 116/74 | HR 76 | Ht 61.5 in | Wt 311.2 lb

## 2021-09-10 DIAGNOSIS — F419 Anxiety disorder, unspecified: Secondary | ICD-10-CM | POA: Diagnosis not present

## 2021-09-10 DIAGNOSIS — G4734 Idiopathic sleep related nonobstructive alveolar hypoventilation: Secondary | ICD-10-CM | POA: Diagnosis not present

## 2021-09-10 DIAGNOSIS — Z6841 Body Mass Index (BMI) 40.0 and over, adult: Secondary | ICD-10-CM

## 2021-09-10 DIAGNOSIS — E662 Morbid (severe) obesity with alveolar hypoventilation: Secondary | ICD-10-CM

## 2021-09-10 DIAGNOSIS — Z1239 Encounter for other screening for malignant neoplasm of breast: Secondary | ICD-10-CM

## 2021-09-10 DIAGNOSIS — I10 Essential (primary) hypertension: Secondary | ICD-10-CM

## 2021-09-10 DIAGNOSIS — Z0000 Encounter for general adult medical examination without abnormal findings: Secondary | ICD-10-CM | POA: Diagnosis not present

## 2021-09-10 DIAGNOSIS — Z124 Encounter for screening for malignant neoplasm of cervix: Secondary | ICD-10-CM

## 2021-09-10 DIAGNOSIS — Z1211 Encounter for screening for malignant neoplasm of colon: Secondary | ICD-10-CM

## 2021-09-10 NOTE — Addendum Note (Signed)
Addended by: Melody Comas L on: 09/10/2021 11:45 AM   Modules accepted: Orders

## 2021-09-10 NOTE — Progress Notes (Addendum)
Established Patient Office Visit  Subjective:  Patient ID: Sharon Jennings, female    DOB: 1980-06-02  Age: 41 y.o. MRN: [id number]  CC:  Chief Complaint  Patient presents with   Annual Exam    Hypertension This is a chronic problem. The current episode started more than 1 year ago. The problem has been resolved since onset. The problem is controlled. Associated symptoms include headaches and shortness of breath. Pertinent negatives include no anxiety, blurred vision, chest pain, neck pain, orthopnea, palpitations, peripheral edema or sweats.    Sharon Jennings presents for physical  Past Medical History:  Diagnosis Date   Anxiety    Arthritis    Depression    Heartburn    High cholesterol    Hypertension    Obesity     Past Surgical History:  Procedure Laterality Date   CESAREAN SECTION  2005    Family History  Problem Relation Age of Onset   Hypertension Mother    Cancer Father    Cancer Paternal Grandmother    Diabetes Paternal Grandfather     Social History   Socioeconomic History   Marital status: Single    Spouse name: Not on file   Number of children: Not on file   Years of education: Not on file   Highest education level: Not on file  Occupational History   Not on file  Tobacco Use   Smoking status: Former    Types: Cigarettes    Quit date: 02/21/2014    Years since quitting: 7.5   Smokeless tobacco: Never  Substance and Sexual Activity   Alcohol use: Yes   Drug use: No   Sexual activity: Yes  Other Topics Concern   Not on file  Social History Narrative   Not on file   Social Determinants of Health   Financial Resource Strain: Not on file  Food Insecurity: Not on file  Transportation Needs: Not on file  Physical Activity: Not on file  Stress: Not on file  Social Connections: Not on file  Intimate Partner Violence: Not on file     Current Outpatient Medications:    amLODipine (NORVASC) 5 MG tablet, TAKE 1 TABLET BY MOUTH EVERY DAY,  Disp: 90 tablet, Rfl: 3   famotidine (PEPCID) 20 MG tablet, Take 1 tablet (20 mg total) by mouth 2 (two) times daily., Disp: 60 tablet, Rfl: 4   hydrochlorothiazide (HYDRODIURIL) 12.5 MG tablet, Take 2 tablets (25 mg total) by mouth daily., Disp: 30 tablet, Rfl: 0   meloxicam (MOBIC) 15 MG tablet, Take 1 tablet (15 mg total) by mouth daily., Disp: 30 tablet, Rfl: 1   Allergies  Allergen Reactions   Ranitidine     PT reports shaking and heart palpitations   Nexium [Esomeprazole Magnesium] Anxiety    Pt reports shaking and palpitations.     ROS Review of Systems  Constitutional: Negative.   HENT: Negative.    Eyes: Negative.  Negative for blurred vision.  Respiratory:  Positive for shortness of breath.   Cardiovascular: Negative.  Negative for chest pain, palpitations and orthopnea.  Gastrointestinal: Negative.   Endocrine: Negative.   Genitourinary: Negative.   Musculoskeletal: Negative.  Negative for neck pain.  Skin: Negative.   Allergic/Immunologic: Negative.   Neurological:  Positive for headaches. Negative for dizziness, tremors, seizures, syncope, speech difficulty, weakness and light-headedness.  Hematological: Negative.  Negative for adenopathy. Does not bruise/bleed easily.  Psychiatric/Behavioral: Negative.  Negative for agitation, behavioral problems and dysphoric mood.   All  other systems reviewed and are negative.     Objective:    Physical Exam Vitals reviewed.  Constitutional:      Appearance: Normal appearance. She is obese.  HENT:     Head: Atraumatic.     Mouth/Throat:     Mouth: Mucous membranes are moist.  Eyes:     Pupils: Pupils are equal, round, and reactive to light.  Neck:     Vascular: No carotid bruit.  Cardiovascular:     Rate and Rhythm: Normal rate and regular rhythm.     Pulses: Normal pulses.     Heart sounds: Normal heart sounds.  Pulmonary:     Effort: Pulmonary effort is normal.     Breath sounds: Normal breath sounds.   Abdominal:     General: Bowel sounds are normal.     Palpations: Abdomen is soft. There is no hepatomegaly, splenomegaly or mass.     Tenderness: There is no abdominal tenderness.     Hernia: No hernia is present.  Musculoskeletal:        General: No tenderness.     Cervical back: Neck supple.     Right lower leg: No edema.     Left lower leg: No edema.  Skin:    Findings: No rash.  Neurological:     Mental Status: She is alert and oriented to person, place, and time.     Motor: No weakness.  Psychiatric:        Mood and Affect: Mood and affect normal.        Behavior: Behavior normal.     BP 116/74   Pulse 76   Ht 5' 1.5" (1.562 m)   Wt (!) 311 lb 3.2 oz (141.2 kg)   BMI 57.85 kg/m  Wt Readings from Last 3 Encounters:  09/10/21 (!) 311 lb 3.2 oz (141.2 kg)  07/15/21 (!) 318 lb 3.2 oz (144.3 kg)  04/24/21 (!) 317 lb 9.6 oz (144.1 kg)     Health Maintenance Due  Topic Date Due   HIV Screening  Never done   Hepatitis C Screening  Never done   TETANUS/TDAP  Never done   PAP SMEAR-Modifier  Never done   COVID-19 Vaccine (3 - Pfizer series) 12/12/2019    There are no preventive care reminders to display for this patient.  Lab Results  Component Value Date   TSH 2.51 04/24/2021   Lab Results  Component Value Date   WBC 6.0 09/06/2020   HGB 11.9 09/06/2020   HCT 38.2 09/06/2020   MCV 80 09/06/2020   PLT 316 09/06/2020   Lab Results  Component Value Date   NA 138 04/24/2021   K 4.7 04/24/2021   CO2 25 04/24/2021   GLUCOSE 102 (H) 04/24/2021   BUN 14 04/24/2021   CREATININE 1.08 (H) 04/24/2021   BILITOT 0.3 04/24/2021   ALKPHOS 66 09/06/2020   AST 24 04/24/2021   ALT 16 04/24/2021   PROT 6.9 04/24/2021   ALBUMIN 3.8 09/06/2020   CALCIUM 9.1 04/24/2021   ANIONGAP 7 05/13/2018   EGFR 66 04/24/2021   Lab Results  Component Value Date   CHOL 211 (H) 09/06/2020   Lab Results  Component Value Date   HDL 45 09/06/2020   Lab Results  Component  Value Date   LDLCALC 156 (H) 09/06/2020   Lab Results  Component Value Date   TRIG 55 09/06/2020   No results found for: "CHOLHDL" No results found for: "HGBA1C"    Assessment &  Plan:   Problem List Items Addressed This Visit       Cardiovascular and Mediastinum   Hypertension     Patient denies any chest pain or shortness of breath there is no history of palpitation or paroxysmal nocturnal dyspnea   patient was advised to follow low-salt low-cholesterol diet    ideally I want to keep systolic blood pressure below 130 mmHg, patient was asked to check blood pressure one times a week and give me a report on that.  Patient will be follow-up in 3 months  or earlier as needed, patient will call me back for any change in the cardiovascular symptoms Patient was advised to buy a book from local bookstore concerning blood pressure and read several chapters  every day.  This will be supplemented by some of the material we will give him from the office.  Patient should also utilize other resources like YouTube and Internet to learn more about the blood pressure and the diet.        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  - I educated them on making healthy dietary choices including eating more fruits and vegetables and less fried foods. - I encouraged the patient to exercise more, and educated on the benefits of exercise including weight loss, diabetes prevention, and hypertension prevention.   Dietary counseling with a registered dietician  Referral to a weight management support group (e.g. Weight Watchers, Overeaters Anonymous)  If your BMI is greater than 29 or you have gained more than 15 pounds you should work on weight loss.  Attend a healthy cooking class       Idiopathic sleep related nonobstructive alveolar hypoventilation     Patient is referred to the sleep specialist.        Other   Anxiety     Keeping a stress/anxiety diary. This can help you learn what triggers your reaction and then learn ways to manage your response.  Thinking about how you react to certain situations. You may not be able to control everything, but you can control your response.  Making time for activities that help you relax and not feeling guilty about spending your time in this way.  Visual imagery and yoga can help you stay calm and relax.      Other Visit Diagnoses     Annual physical exam    -  Primary     Patient will be sent to GI specialist for anemia  No orders of the defined types were placed in this encounter.   Follow-up: No follow-ups on file.    Cletis Athens, MD

## 2021-09-10 NOTE — Assessment & Plan Note (Signed)
Patient is referred to the sleep specialist.

## 2021-09-10 NOTE — Assessment & Plan Note (Signed)

## 2021-09-10 NOTE — Assessment & Plan Note (Signed)
   Keeping a stress/anxiety diary. This can help you learn what triggers your reaction and then learn ways to manage your response.  Thinking about how you react to certain situations. You may not be able to control everything, but you can control your response.  Making time for activities that help you relax and not feeling guilty about spending your time in this way.  Visual imagery and yoga can help you stay calm and relax. 

## 2021-09-10 NOTE — Assessment & Plan Note (Signed)

## 2021-09-11 ENCOUNTER — Other Ambulatory Visit: Payer: Self-pay

## 2021-09-11 ENCOUNTER — Telehealth: Payer: Self-pay

## 2021-09-11 DIAGNOSIS — Z1211 Encounter for screening for malignant neoplasm of colon: Secondary | ICD-10-CM

## 2021-09-11 MED ORDER — NA SULFATE-K SULFATE-MG SULF 17.5-3.13-1.6 GM/177ML PO SOLN: 1.0000 | Freq: Once | ORAL | 0 refills | Status: AC

## 2021-09-11 NOTE — Telephone Encounter (Signed)
Gastroenterology Pre-Procedure Review  Request Date: 10/22/21 Requesting Physician: Dr. Tobi Bastos  PATIENT REVIEW QUESTIONS: The patient responded to the following health history questions as indicated:    1. Are you having any GI issues? yes (acid reflux controlled with Pepcid  and Gaviscon) Pt states she is anemic 2. Do you have a personal history of Polyps? no 3. Do you have a family history of Colon Cancer or Polyps? no 4. Diabetes Mellitus? no 5. Joint replacements in the past 12 months?no 6. Major health problems in the past 3 months?no 7. Any artificial heart valves, MVP, or defibrillator?no    MEDICATIONS & ALLERGIES:    Patient reports the following regarding taking any anticoagulation/antiplatelet therapy:   Plavix, Coumadin, Eliquis, Xarelto, Lovenox, Pradaxa, Brilinta, or Effient? no Aspirin? no  Patient confirms/reports the following medications:  Current Outpatient Medications  Medication Sig Dispense Refill   amLODipine (NORVASC) 5 MG tablet TAKE 1 TABLET BY MOUTH EVERY DAY 90 tablet 3   famotidine (PEPCID) 20 MG tablet Take 1 tablet (20 mg total) by mouth 2 (two) times daily. 60 tablet 4   hydrochlorothiazide (HYDRODIURIL) 12.5 MG tablet Take 2 tablets (25 mg total) by mouth daily. 30 tablet 0   meloxicam (MOBIC) 15 MG tablet Take 1 tablet (15 mg total) by mouth daily. 30 tablet 1   No current facility-administered medications for this visit.    Patient confirms/reports the following allergies:  Allergies  Allergen Reactions   Ranitidine     PT reports shaking and heart palpitations   Nexium [Esomeprazole Magnesium] Anxiety    Pt reports shaking and palpitations.     No orders of the defined types were placed in this encounter.   AUTHORIZATION INFORMATION Primary Insurance: 1D#: Group #:  Secondary Insurance: 1D#: Group #:  SCHEDULE INFORMATION: Date: 10/25/21 Time: Location: [facility]

## 2021-09-29 ENCOUNTER — Ambulatory Visit: Payer: Self-pay | Admitting: Internal Medicine

## 2021-09-29 NOTE — Progress Notes (Signed)
Pt did not wish to pay her copay, so she elected not to be seen.

## 2021-10-22 ENCOUNTER — Ambulatory Visit
Admission: RE | Admit: 2021-10-22 | Payer: No Typology Code available for payment source | Source: Ambulatory Visit | Admitting: Gastroenterology

## 2021-10-22 ENCOUNTER — Encounter: Admission: RE | Payer: Self-pay | Source: Ambulatory Visit

## 2021-10-22 SURGERY — COLONOSCOPY WITH PROPOFOL
Anesthesia: General

## 2021-10-23 ENCOUNTER — Encounter: Payer: Self-pay | Admitting: Obstetrics & Gynecology

## 2021-11-11 ENCOUNTER — Encounter: Payer: Self-pay | Admitting: Obstetrics & Gynecology

## 2021-11-24 ENCOUNTER — Encounter: Payer: Self-pay | Admitting: Obstetrics & Gynecology

## 2021-12-15 HISTORY — PX: BARIATRIC SURGERY: SHX1103

## 2022-01-20 ENCOUNTER — Ambulatory Visit (INDEPENDENT_AMBULATORY_CARE_PROVIDER_SITE_OTHER): Payer: No Typology Code available for payment source | Admitting: Internal Medicine

## 2022-01-20 ENCOUNTER — Encounter: Payer: Self-pay | Admitting: Internal Medicine

## 2022-01-20 VITALS — BP 116/82 | HR 64 | Temp 97.6°F | Ht 61.5 in | Wt 265.8 lb

## 2022-01-20 DIAGNOSIS — E662 Morbid (severe) obesity with alveolar hypoventilation: Secondary | ICD-10-CM

## 2022-01-20 DIAGNOSIS — G4734 Idiopathic sleep related nonobstructive alveolar hypoventilation: Secondary | ICD-10-CM | POA: Diagnosis not present

## 2022-01-20 DIAGNOSIS — F419 Anxiety disorder, unspecified: Secondary | ICD-10-CM | POA: Diagnosis not present

## 2022-01-20 DIAGNOSIS — B379 Candidiasis, unspecified: Secondary | ICD-10-CM

## 2022-01-20 DIAGNOSIS — Z6841 Body Mass Index (BMI) 40.0 and over, adult: Secondary | ICD-10-CM

## 2022-01-20 DIAGNOSIS — I10 Essential (primary) hypertension: Secondary | ICD-10-CM | POA: Diagnosis not present

## 2022-01-20 DIAGNOSIS — D649 Anemia, unspecified: Secondary | ICD-10-CM

## 2022-01-20 MED ORDER — NYSTATIN-TRIAMCINOLONE 100000-0.1 UNIT/GM-% EX OINT: 1.0000 | TOPICAL_OINTMENT | Freq: Two times a day (BID) | CUTANEOUS | 0 refills | Status: AC

## 2022-01-20 NOTE — Assessment & Plan Note (Signed)
Anxiety is much better after she lost weight

## 2022-01-20 NOTE — Assessment & Plan Note (Signed)
Will check CBC 

## 2022-01-20 NOTE — Addendum Note (Signed)
Addended by: Jama Flavors on: 01/20/2022 11:27 AM   Modules accepted: Orders

## 2022-01-20 NOTE — Progress Notes (Signed)
Established Patient Office Visit  Subjective:  Patient ID: Sharon Jennings, female    DOB: [birth year]  Age: 41 y.o. MRN: [id number]  CC:  Chief Complaint  Patient presents with   Hypertension    Had weightloss surgery on 10/30 and wanted to follow up. Is fasting if labs needed    Hypertension    Sharon Jennings presents for check up  Past Medical History:  Diagnosis Date   Anxiety    Arthritis    Depression    Heartburn    High cholesterol    Hypertension    Obesity     Past Surgical History:  Procedure Laterality Date   CESAREAN SECTION  2005    Family History  Problem Relation Age of Onset   Hypertension Mother    Cancer Father    Cancer Paternal Grandmother    Diabetes Paternal Grandfather     Social History   Socioeconomic History   Marital status: Single    Spouse name: Not on file   Number of children: Not on file   Years of education: Not on file   Highest education level: Not on file  Occupational History   Not on file  Tobacco Use   Smoking status: Former    Types: Cigarettes    Quit date: 02/21/2014    Years since quitting: 7.9   Smokeless tobacco: Never  Substance and Sexual Activity   Alcohol use: Yes   Drug use: No   Sexual activity: Yes  Other Topics Concern   Not on file  Social History Narrative   Not on file   Social Determinants of Health   Financial Resource Strain: Not on file  Food Insecurity: Not on file  Transportation Needs: Not on file  Physical Activity: Not on file  Stress: Not on file  Social Connections: Not on file  Intimate Partner Violence: Not on file     Current Outpatient Medications:    Biotin 5000 MCG CAPS, Take by mouth., Disp: , Rfl:    Cholecalciferol (D3 PO), Take 5,000 Units by mouth in the morning and at bedtime., Disp: , Rfl:    Multiple Vitamins-Minerals (BARIATRIC MULTIVITAMINS/IRON PO), Take by mouth., Disp: , Rfl:    pantoprazole (PROTONIX) 40 MG tablet, Take 40 mg by mouth daily., Disp: ,  Rfl:    ursodiol (ACTIGALL) 300 MG capsule, Take 300 mg by mouth 2 (two) times daily., Disp: , Rfl:    amLODipine (NORVASC) 5 MG tablet, TAKE 1 TABLET BY MOUTH EVERY DAY (Patient not taking: Reported on 01/20/2022), Disp: 90 tablet, Rfl: 3   famotidine (PEPCID) 20 MG tablet, Take 1 tablet (20 mg total) by mouth 2 (two) times daily. (Patient not taking: Reported on 01/20/2022), Disp: 60 tablet, Rfl: 4   hydrochlorothiazide (HYDRODIURIL) 12.5 MG tablet, Take 2 tablets (25 mg total) by mouth daily. (Patient not taking: Reported on 01/20/2022), Disp: 30 tablet, Rfl: 0   meloxicam (MOBIC) 15 MG tablet, Take 1 tablet (15 mg total) by mouth daily. (Patient not taking: Reported on 01/20/2022), Disp: 30 tablet, Rfl: 1   Allergies  Allergen Reactions   Ranitidine     PT reports shaking and heart palpitations   Nexium [Esomeprazole Magnesium] Anxiety    Pt reports shaking and palpitations.     ROS Review of Systems  Constitutional: Negative.   HENT: Negative.    Eyes: Negative.   Respiratory: Negative.    Cardiovascular: Negative.   Gastrointestinal: Negative.   Endocrine: Negative.   Genitourinary:  Negative.   Musculoskeletal: Negative.   Skin: Negative.   Allergic/Immunologic: Negative.   Neurological: Negative.   Hematological: Negative.   Psychiatric/Behavioral: Negative.    All other systems reviewed and are negative.     Objective:    Physical Exam Vitals reviewed.  Constitutional:      Appearance: Normal appearance.  HENT:     Mouth/Throat:     Mouth: Mucous membranes are moist.  Eyes:     Pupils: Pupils are equal, round, and reactive to light.  Neck:     Vascular: No carotid bruit.  Cardiovascular:     Rate and Rhythm: Normal rate and regular rhythm.     Pulses: Normal pulses.     Heart sounds: Normal heart sounds.  Pulmonary:     Effort: Pulmonary effort is normal.     Breath sounds: Normal breath sounds.  Abdominal:     General: Bowel sounds are normal.      Palpations: Abdomen is soft. There is no hepatomegaly, splenomegaly or mass.     Tenderness: There is no abdominal tenderness.     Hernia: No hernia is present.  Musculoskeletal:        General: No tenderness.     Cervical back: Neck supple.     Right lower leg: No edema.     Left lower leg: No edema.  Skin:    Findings: No rash.  Neurological:     Mental Status: She is alert and oriented to person, place, and time.     Motor: No weakness.  Psychiatric:        Mood and Affect: Mood and affect normal.        Behavior: Behavior normal.     BP 116/82   Pulse 64   Temp 97.6 F (36.4 C) (Temporal)   Ht 5' 1.5" (1.562 m)   Wt 265 lb 12.8 oz (120.6 kg)   SpO2 99%   BMI 49.41 kg/m  Wt Readings from Last 3 Encounters:  01/20/22 265 lb 12.8 oz (120.6 kg)  09/10/21 (!) 311 lb 3.2 oz (141.2 kg)  07/15/21 (!) 318 lb 3.2 oz (144.3 kg)     Health Maintenance Due  Topic Date Due   HIV Screening  Never done   Hepatitis C Screening  Never done   DTaP/Tdap/Td (1 - Tdap) Never done   PAP SMEAR-Modifier  Never done    There are no preventive care reminders to display for this patient.  Lab Results  Component Value Date   TSH 2.51 04/24/2021   Lab Results  Component Value Date   WBC 6.0 09/06/2020   HGB 11.9 09/06/2020   HCT 38.2 09/06/2020   MCV 80 09/06/2020   PLT 316 09/06/2020   Lab Results  Component Value Date   NA 138 04/24/2021   K 4.7 04/24/2021   CO2 25 04/24/2021   GLUCOSE 102 (H) 04/24/2021   BUN 14 04/24/2021   CREATININE 1.08 (H) 04/24/2021   BILITOT 0.3 04/24/2021   ALKPHOS 66 09/06/2020   AST 24 04/24/2021   ALT 16 04/24/2021   PROT 6.9 04/24/2021   ALBUMIN 3.8 09/06/2020   CALCIUM 9.1 04/24/2021   ANIONGAP 7 05/13/2018   EGFR 66 04/24/2021   Lab Results  Component Value Date   CHOL 211 (H) 09/06/2020   Lab Results  Component Value Date   HDL 45 09/06/2020   Lab Results  Component Value Date   LDLCALC 156 (H) 09/06/2020   Lab Results   Component  Value Date   TRIG 55 09/06/2020   No results found for: "CHOLHDL" No results found for: "HGBA1C"    Assessment & Plan:   Problem List Items Addressed This Visit       Cardiovascular and Mediastinum   Hypertension - Primary     Patient denies any chest pain or shortness of breath there is no history of palpitation or paroxysmal nocturnal dyspnea   patient was advised to follow low-salt low-cholesterol diet    ideally I want to keep systolic blood pressure below 130 mmHg, patient was asked to check blood pressure one times a week and give me a report on that.  Patient will be follow-up in 3 months  or earlier as needed, patient will call me back for any change in the cardiovascular symptoms Patient was advised to buy a book from local bookstore concerning blood pressure and read several chapters  every day.  This will be supplemented by some of the material we will give him from the office.  Patient should also utilize other resources like YouTube and Internet to learn more about the blood pressure and the diet.        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   Follow-up: No follow-ups on file.    Cletis Athens, MD

## 2022-01-20 NOTE — Assessment & Plan Note (Signed)
Patient has lost 30 pounds of weight after the bypass surgery

## 2022-01-20 NOTE — Assessment & Plan Note (Signed)
Stable at the present time. 

## 2022-01-20 NOTE — Assessment & Plan Note (Signed)

## 2022-02-13 ENCOUNTER — Encounter (HOSPITAL_COMMUNITY): Payer: Self-pay

## 2022-02-13 ENCOUNTER — Ambulatory Visit (HOSPITAL_COMMUNITY)
Admission: EM | Admit: 2022-02-13 | Discharge: 2022-02-13 | Disposition: A | Discharge status: Home / Self Care | Payer: No Typology Code available for payment source | Attending: Nurse Practitioner | Admitting: Nurse Practitioner

## 2022-02-13 DIAGNOSIS — J069 Acute upper respiratory infection, unspecified: Secondary | ICD-10-CM

## 2022-02-13 DIAGNOSIS — R030 Elevated blood-pressure reading, without diagnosis of hypertension: Secondary | ICD-10-CM | POA: Diagnosis present

## 2022-02-13 DIAGNOSIS — Z1152 Encounter for screening for COVID-19: Secondary | ICD-10-CM

## 2022-02-13 LAB — POC INFLUENZA A AND B ANTIGEN (URGENT CARE ONLY)
INFLUENZA A ANTIGEN, POC: NEGATIVE
INFLUENZA B ANTIGEN, POC: NEGATIVE

## 2022-02-13 LAB — SARS CORONAVIRUS 2 (TAT 6-24 HRS): SARS Coronavirus 2: NEGATIVE

## 2022-02-13 NOTE — ED Triage Notes (Signed)
Pt is here for cough , SOB, nasal congestion, runny nose, bilateral ear pain , exposed to the flu , no taste or smell, back , hip and thigh pain x 5days

## 2022-02-13 NOTE — ED Provider Notes (Signed)
[facility]    CSN: [id number] Arrival date & time: 02/13/22  7893      History   Chief Complaint Chief Complaint  Patient presents with   Abdominal Pain   Cough    HPI Sharon Jennings is a 41 y.o. female.   Subjective:   Sharon Jennings is a 41 y.o. female who presents for evaluation of symptoms of a URI. Symptoms include hot and cold spells, nasal congestion, sneezing, cough, shortness of breath, loose stools, myalgias, cough, bilateral ear pressure, nausea, headache, hiccups and loss of taste/smell. Onset of symptoms was 5 days ago and has been unchanged since that time.  She denies any wheezing, chills, fevers, runny nose, sore throat or dizziness.  She has a history of weight loss surgery back in October 2023.  She is drinking moderate amounts of fluids.  She has not tried anything for her symptoms.  Both of her children was recently diagnosed with flu over the past couple of weeks.  Patient has a history of COVID in the past.  She has been vaccinated against COVID.  The following portions of the patient's history were reviewed and updated as appropriate: allergies, current medications, past family history, past medical history, past social history, past surgical history, and problem list.           Past Medical History:  Diagnosis Date   Anxiety    Arthritis    Depression    Heartburn    High cholesterol    Hypertension    Obesity     Patient Active Problem List   Diagnosis Date Noted   Muscle cramps 04/24/2021   Anemia 04/24/2021   Hypertension 09/03/2020   Arthritis 09/03/2020   Class 3 obesity with alveolar hypoventilation without serious comorbidity with body mass index (BMI) of 50.0 to 59.9 in adult (HCC) 09/03/2020   Idiopathic sleep related nonobstructive alveolar hypoventilation 09/03/2020   Anxiety 09/03/2020   Midline low back pain without sciatica 09/03/2020    Past Surgical History:  Procedure Laterality Date   BARIATRIC SURGERY   12/15/2021   CESAREAN SECTION  02/17/2003    OB History   No obstetric history on file.      Home Medications    Prior to Admission medications   Medication Sig Start Date End Date Taking? Authorizing Provider  amLODipine (NORVASC) 5 MG tablet TAKE 1 TABLET BY MOUTH EVERY DAY Patient not taking: Reported on 01/20/2022 01/16/21   Corky Downs, MD  Biotin 5000 MCG CAPS Take by mouth.    [provider]  Cholecalciferol (D3 PO) Take 5,000 Units by mouth in the morning and at bedtime.    [provider]  famotidine (PEPCID) 20 MG tablet Take 1 tablet (20 mg total) by mouth 2 (two) times daily. Patient not taking: Reported on 01/20/2022 07/15/21   Corky Downs, MD  hydrochlorothiazide (HYDRODIURIL) 12.5 MG tablet Take 2 tablets (25 mg total) by mouth daily. Patient not taking: Reported on 01/20/2022 05/13/18   Cathren Laine, MD  meloxicam (MOBIC) 15 MG tablet Take 1 tablet (15 mg total) by mouth daily. Patient not taking: Reported on 01/20/2022 03/28/21 03/28/22  Tommi Rumps, PA-C  Multiple Vitamins-Minerals (BARIATRIC MULTIVITAMINS/IRON PO) Take by mouth.    [provider]  nystatin-triamcinolone ointment (MYCOLOG) Apply 1 Application topically 2 (two) times daily. 01/20/22   Corky Downs, MD  pantoprazole (PROTONIX) 40 MG tablet Take 40 mg by mouth daily.    [provider]  ursodiol (ACTIGALL) 300 MG  capsule Take 300 mg by mouth 2 (two) times daily.    [provider]    Family History Family History  Problem Relation Age of Onset   Hypertension Mother    Cancer Father    Cancer Paternal Grandmother    Diabetes Paternal Grandfather     Social History Social History   Tobacco Use   Smoking status: Former    Types: Cigarettes    Quit date: 02/21/2014    Years since quitting: 7.9   Smokeless tobacco: Never  Substance Use Topics   Alcohol use: Yes   Drug use: No     Allergies   Ranitidine and Nexium [esomeprazole  magnesium]   Review of Systems Review of Systems  Constitutional:  Positive for fatigue. Negative for chills and fever.  HENT:  Positive for congestion, ear pain and sneezing. Negative for rhinorrhea and sore throat.   Respiratory:  Positive for cough and shortness of breath. Negative for wheezing.   Cardiovascular:  Negative for chest pain.  Gastrointestinal:  Positive for diarrhea and nausea. Negative for vomiting.  Musculoskeletal:  Positive for myalgias.  Neurological:  Positive for headaches. Negative for dizziness.  All other systems reviewed and are negative.    Physical Exam Triage Vital Signs ED Triage Vitals  Enc Vitals Group     BP 02/13/22 0815 (!) 160/106     Pulse Rate 02/13/22 0815 77     Resp --      Temp 02/13/22 0815 98 F (36.7 C)     Temp Source 02/13/22 0815 Oral     SpO2 02/13/22 0815 98 %     Weight --      Height --      Head Circumference --      Peak Flow --      Pain Score 02/13/22 0816 0     Pain Loc --      Pain Edu? --      Excl. in [place]? --    No data found.  Updated Vital Signs BP (!) 160/106 (BP Location: Left Wrist)   Pulse 77   Temp 98 F (36.7 C) (Oral)   LMP 01/30/2022   SpO2 98%   Visual Acuity Right Eye Distance:   Left Eye Distance:   Bilateral Distance:    Right Eye Near:   Left Eye Near:    Bilateral Near:     Physical Exam Vitals reviewed.  Constitutional:      General: She is not in acute distress.    Appearance: She is well-developed. She is obese. She is not ill-appearing, toxic-appearing or diaphoretic.  HENT:     Head: Normocephalic.     Right Ear: Tympanic membrane, ear canal and external ear normal.     Left Ear: Tympanic membrane, ear canal and external ear normal.     Nose: Congestion present.     Mouth/Throat:     Mouth: Mucous membranes are moist.  Eyes:     Extraocular Movements: Extraocular movements intact.     Conjunctiva/sclera: Conjunctivae normal.  Cardiovascular:     Rate and Rhythm:  Normal rate and regular rhythm.  Pulmonary:     Effort: Pulmonary effort is normal.     Breath sounds: Normal breath sounds.  Abdominal:     Palpations: Abdomen is soft.  Musculoskeletal:        General: Normal range of motion.     Cervical back: Normal range of motion and neck supple.  Lymphadenopathy:  Cervical: No cervical adenopathy.  Skin:    General: Skin is warm and dry.  Neurological:     General: No focal deficit present.     Mental Status: She is alert and oriented to person, place, and time.      UC Treatments / Results  Labs (all labs ordered are listed, but only abnormal results are displayed) Labs Reviewed  SARS CORONAVIRUS 2 (TAT 6-24 HRS)  POC INFLUENZA A AND B ANTIGEN (URGENT CARE ONLY)    EKG   Radiology No results found.  Procedures Procedures (including critical care time)  Medications Ordered in UC Medications - No data to display  Initial Impression / Assessment and Plan / UC Course  I have reviewed the triage vital signs and the nursing notes.  Pertinent labs & imaging results that were available during my care of the patient were reviewed by me and considered in my medical decision making (see chart for details).    41 year old female presenting with URI symptoms.  She has known flu exposure.  She is afebrile.  Nontoxic.  Blood pressure elevated.  No indication of hypertensive urgency or emergency.  Physical exam as above.  Flu negative.  COVID test pending. Suggested symptomatic OTC remedies. Follow up as needed.  Today's evaluation has revealed no signs of a dangerous process. Discussed diagnosis with patient and/or guardian. Patient and/or guardian aware of their diagnosis, possible red flag symptoms to watch out for and need for close follow up. Patient and/or guardian understands verbal and written discharge instructions. Patient and/or guardian comfortable with plan and disposition.  Patient and/or guardian has a clear mental status at  this time, good insight into illness (after discussion and teaching) and has clear judgment to make decisions regarding their care  Documentation was completed with the aid of voice recognition software. Transcription may contain typographical errors. Final Clinical Impressions(s) / UC Diagnoses   Final diagnoses:  Viral URI with cough  Encounter for screening for COVID-19  Elevated blood pressure reading in office without diagnosis of hypertension     Discharge Instructions      Your symptoms are likely due to a viral respiratory infection. A respiratory infection is an illness that affects part of the respiratory system, such as the lungs, nose, or throat. Antibiotic medicines are not prescribed for viral infections. This is because antibiotics are designed to kill bacteria. They do not kill viruses. Take medications for cough as prescribed. You may take tylenol or ibuprofen as needed for fevers/headache/body aches. Drink plenty of fluids. Stay in home isolation until you receive results of your COVID test. You will only be notified for positive results. You may go online to [facility] and review your results. Your blood pressure was elevated today. Please follow-up with your primary care provider for re-check in 3 days.   Go to the ED immediately if:  You get a very bad headache. You start to feel confused  You feel weak or numb. You feel faint. You have chest pain       ED Prescriptions   None    PDMP not reviewed this encounter.   Orlin Hilding Stirling, [place] 02/13/22 [phone]

## 2022-02-13 NOTE — Discharge Instructions (Signed)
Your symptoms are likely due to a viral respiratory infection. A respiratory infection is an illness that affects part of the respiratory system, such as the lungs, nose, or throat. Antibiotic medicines are not prescribed for viral infections. This is because antibiotics are designed to kill bacteria. They do not kill viruses. Take medications for cough as prescribed. You may take tylenol or ibuprofen as needed for fevers/headache/body aches. Drink plenty of fluids. Stay in home isolation until you receive results of your COVID test. You will only be notified for positive results. You may go online to MyChart and review your results. Your blood pressure was elevated today. Please follow-up with your primary care provider for re-check in 3 days.   Go to the ED immediately if:  You get a very bad headache. You start to feel confused  You feel weak or numb. You feel faint. You have chest pain

## 2022-04-21 ENCOUNTER — Ambulatory Visit: Payer: No Typology Code available for payment source | Admitting: Internal Medicine

## 2023-01-25 ENCOUNTER — Emergency Department: Payer: No Typology Code available for payment source

## 2023-01-25 ENCOUNTER — Other Ambulatory Visit: Payer: Self-pay

## 2023-01-25 ENCOUNTER — Emergency Department
Admission: EM | Admit: 2023-01-25 | Discharge: 2023-01-25 | Disposition: A | Discharge status: Home / Self Care | Payer: No Typology Code available for payment source | Attending: Emergency Medicine | Admitting: Emergency Medicine

## 2023-01-25 DIAGNOSIS — S40011A Contusion of right shoulder, initial encounter: Secondary | ICD-10-CM | POA: Diagnosis not present

## 2023-01-25 DIAGNOSIS — S161XXA Strain of muscle, fascia and tendon at neck level, initial encounter: Secondary | ICD-10-CM

## 2023-01-25 DIAGNOSIS — S91112A Laceration without foreign body of left great toe without damage to nail, initial encounter: Secondary | ICD-10-CM

## 2023-01-25 DIAGNOSIS — W19XXXA Unspecified fall, initial encounter: Secondary | ICD-10-CM

## 2023-01-25 DIAGNOSIS — Y93E1 Activity, personal bathing and showering: Secondary | ICD-10-CM | POA: Diagnosis not present

## 2023-01-25 DIAGNOSIS — Z23 Encounter for immunization: Secondary | ICD-10-CM | POA: Diagnosis not present

## 2023-01-25 DIAGNOSIS — Y92009 Unspecified place in unspecified non-institutional (private) residence as the place of occurrence of the external cause: Secondary | ICD-10-CM

## 2023-01-25 MED ORDER — HYDROCODONE-ACETAMINOPHEN 5-325 MG PO TABS: 1.0000 | ORAL_TABLET | Freq: Four times a day (QID) | ORAL | 0 refills | Status: AC | PRN

## 2023-01-25 MED ORDER — TETANUS-DIPHTH-ACELL PERTUSSIS 5-2.5-18.5 LF-MCG/0.5 IM SUSY
0.5000 mL | PREFILLED_SYRINGE | Freq: Once | INTRAMUSCULAR | Status: AC
  Administered 2023-01-25: 0.5 mL via INTRAMUSCULAR
  Filled 2023-01-25: qty 0.5

## 2023-01-25 NOTE — ED Notes (Signed)
See triage note  Presents s/p fall States she fell in  the shower  Having pain to neck and right shoulder  Also hit her left great toe  Small laceration note

## 2023-01-25 NOTE — ED Provider Notes (Signed)
[facility] Provider Note    Event Date/Time   First MD Initiated Contact with Patient 01/25/23 1310     (approximate)   History   Fall   HPI  Sharon Jennings is a 42 y.o. female   presents to the ED after a fall that occurred this morning while she was in the shower.  Patient complains of neck pain and right shoulder pain.  She also has a laceration to her left great toe.  She denies any loss of consciousness, hitting her head, vision changes, nausea or vomiting.  Patient reports that the last tetanus she got was probably 20 years ago when her daughter was born.  Has a history of hypertension, anxiety, arthritis, anemia, midline low back pain without sciatica.      Physical Exam   Triage Vital Signs: ED Triage Vitals  Encounter Vitals Group     BP 01/25/23 1124 137/82     Systolic BP Percentile --      Diastolic BP Percentile --      Pulse Rate 01/25/23 1124 65     Resp 01/25/23 1124 18     Temp 01/25/23 1124 98 F (36.7 C)     Temp src --      SpO2 01/25/23 1124 100 %     Weight 01/25/23 1121 190 lb (86.2 kg)     Height 01/25/23 1121 5' 1.5" (1.562 m)     Head Circumference --      Peak Flow --      Pain Score 01/25/23 1121 7     Pain Loc --      Pain Education --      Exclude from Growth Chart --     Most recent vital signs: Vitals:   01/25/23 1124  BP: 137/82  Pulse: 65  Resp: 18  Temp: 98 F (36.7 C)  SpO2: 100%     General: Awake, no distress.  Alert, talkative, able to answer questions appropriately. CV:  Good peripheral perfusion.  Heart rate and rate rhythm. Resp:  Normal effort.  Lungs clear bilaterally. Abd:  No distention.  Other:  There is some mild tenderness on palpation of cervical paravertebral muscles but no point tenderness noted on the vertebral bodies.  No ecchymosis or soft tissue edema present.  There is moderate tenderness on palpation of the right proximal humerus and shoulder area without deformity.  Soft  tissue edema present and tender to light palpation.  No discoloration in the area.  Radial pulses present.  On examination of the left great toe medial aspect there is a superficial flap laceration without active bleeding.  No foreign body present.  Nail is intact.  Motor or sensory function intact.   ED Results / Procedures / Treatments   Labs (all labs ordered are listed, but only abnormal results are displayed) Labs Reviewed - No data to display     RADIOLOGY X-rays of the right shoulder were reviewed and interpreted by myself independent of the radiologist and was negative for fracture or dislocation. CT cervical spine per radiologist is negative for acute fractures or malalignment.    PROCEDURES:  Critical Care performed:   Procedures   MEDICATIONS ORDERED IN ED: Medications  Tdap (BOOSTRIX) injection 0.5 mL (0.5 mLs Intramuscular Given 01/25/23 1440)     IMPRESSION / MDM / ASSESSMENT AND PLAN / ED COURSE  I reviewed the triage vital signs and the nursing notes.   Differential diagnosis includes, but is not limited  to, right shoulder fracture, dislocation, contusion, cervical sprain/strain, contusion, cervical fracture, subluxation, laceration left great toe.  42 year old female presents to the ED after a fall that occurred this morning while she was in the shower.  Patient complains of right shoulder pain and cervical pain.  She also has a laceration to her left great toe.  Patient tetanus was updated and has been approximately 20 years since her last immunization.  She was given instructions on how to care for this and watch for signs of infection.  She was reassured that her right shoulder was not fractured.  Because she drove no pain medication was given.  She has a history of bariatric surgery and was told not to take any NSAIDs.  A prescription for hydrocodone was sent to the pharmacy for her to take as needed for pain.  She was aware she cannot drive or operate  machinery while taking this medication.  Otherwise she is to take Tylenol as needed and use ice or heat to her muscles as needed for discomfort.  She has to follow-up with her PCP if any continued problems and a note for work was given.      Patient's presentation is most consistent with acute illness / injury with system symptoms.  FINAL CLINICAL IMPRESSION(S) / ED DIAGNOSES   Final diagnoses:  Cervical strain, acute, initial encounter  Contusion of right shoulder, initial encounter  Laceration of left great toe without foreign body present or damage to nail, initial encounter  Fall in home, initial encounter     Rx / DC Orders   ED Discharge Orders          Ordered    HYDROcodone-acetaminophen (NORCO/VICODIN) 5-325 MG tablet  Every 6 hours PRN        01/25/23 1418             Note:  This document was prepared using Dragon voice recognition software and may include unintentional dictation errors.   Tommi Rumps, PA-C 01/25/23 1512    Jene Every, MD 01/25/23 1540

## 2023-01-25 NOTE — Discharge Instructions (Addendum)
Follow-up with your primary care provider if any continued problems or concerns.  Watch the laceration on your toe for any signs of infection such as redness or pus draining from it.  Allow to get plenty of air which will speed up the healing process.  A prescription for hydrocodone was sent to the pharmacy for you to take as needed for pain.  Do not drive or operate machinery while taking this medication.  Because of your surgery you can only take Tylenol as needed for pain.  You may also use ice or heat to your muscles as needed for discomfort.

## 2023-01-25 NOTE — ED Triage Notes (Signed)
Pt comes with neck pain ,back and shoulder after fall today in shower. Pt states laceration on left big toe. Pt has bandage in place. Pt denies any loc or hitting head.

## 2023-03-23 ENCOUNTER — Other Ambulatory Visit: Payer: Self-pay

## 2023-03-23 ENCOUNTER — Emergency Department (HOSPITAL_COMMUNITY)
Admission: EM | Admit: 2023-03-23 | Discharge: 2023-03-23 | Disposition: A | Discharge status: Home / Self Care | Payer: Self-pay | Attending: Emergency Medicine | Admitting: Emergency Medicine

## 2023-03-23 ENCOUNTER — Encounter (HOSPITAL_COMMUNITY): Payer: Self-pay | Admitting: Emergency Medicine

## 2023-03-23 DIAGNOSIS — Z79899 Other long term (current) drug therapy: Secondary | ICD-10-CM | POA: Insufficient documentation

## 2023-03-23 DIAGNOSIS — I10 Essential (primary) hypertension: Secondary | ICD-10-CM | POA: Insufficient documentation

## 2023-03-23 DIAGNOSIS — T7840XA Allergy, unspecified, initial encounter: Secondary | ICD-10-CM | POA: Insufficient documentation

## 2023-03-23 MED ORDER — KETOROLAC TROMETHAMINE 15 MG/ML IJ SOLN
15.0000 mg | Freq: Once | INTRAMUSCULAR | Status: AC
  Administered 2023-03-23: 15 mg via INTRAMUSCULAR
  Filled 2023-03-23: qty 1

## 2023-03-23 MED ORDER — ACETAMINOPHEN 500 MG PO TABS
1000.0000 mg | ORAL_TABLET | Freq: Once | ORAL | Status: AC
  Administered 2023-03-23: 1000 mg via ORAL
  Filled 2023-03-23: qty 2

## 2023-03-23 NOTE — ED Provider Notes (Signed)
 [place] EMERGENCY DEPARTMENT AT [facility] Provider Note   CSN: [id number] Arrival date & time: 03/23/23  9788     History  Chief Complaint  Patient presents with   Allergic Reaction    Sharon Jennings is a 43 y.o. female.  43 year old female with a history of hypertension, hyperlipidemia, anxiety and depression, obesity presents to the emergency department secondary to concern for allergic reaction.  She states that she awoke from sleep with some discomfort in her central chest which she has experienced in the past with esophageal reflux.  She took some over-the-counter reflux medication and shortly after began to develop a sensation that her lips and tongue were swelling.  She had some lower facial paresthesias as well as a frontal headache and some subjective spasms in her occiput.  The symptoms began around 1 AM.  EMS was called who administered 50 mg of oral Benadryl.  EMS did not report any notable angioedema on scene.  She had no associated urticaria, vomiting, wheezing.  Patient currently reports improvement in her symptoms.  No longer experiencing paresthesias or the sensation of facial swelling.  Denies difficulty speaking, breathing, swallowing.  No history of prior anaphylactic reaction.  The history is provided by the patient. No language interpreter was used.  Allergic Reaction      Home Medications Prior to Admission medications   Medication Sig Start Date End Date Taking? Authorizing Provider  amLODipine (NORVASC) 5 MG tablet TAKE 1 TABLET BY MOUTH EVERY DAY 01/16/21  Yes Masoud, Sheralyn, MD  HYDROcodone -acetaminophen  (NORCO/VICODIN) 5-325 MG tablet Take 1 tablet by mouth every 6 (six) hours as needed. Patient not taking: Reported on 03/23/2023 01/25/23 01/25/24  Saunders Shona CROME, PA-C  nystatin -triamcinolone  ointment (MYCOLOG) Apply 1 Application topically 2 (two) times daily. Patient not taking: Reported on 03/23/2023 01/20/22   Britta Sheralyn, MD       Allergies    Ranitidine and Nexium [esomeprazole magnesium]    Review of Systems   Review of Systems Ten systems reviewed and are negative for acute change, except as noted in the HPI.    Physical Exam Updated Vital Signs BP 131/75   Pulse 68   Temp 97.9 F (36.6 C) (Oral)   Resp 15   Ht 5' 1.5 (1.562 m)   Wt 86.2 kg   SpO2 100%   BMI 35.32 kg/m   Physical Exam Vitals and nursing note reviewed.  Constitutional:      General: She is not in acute distress.    Appearance: She is well-developed. She is not diaphoretic.     Comments: Nontoxic appearing and in NAD  HENT:     Head: Normocephalic and atraumatic.     Mouth/Throat:     Mouth: Mucous membranes are moist.     Comments: No lip or tongue swelling or other evidence of facial or oral angioedema. Tolerating secretions. No stridor or voice muffling.  Eyes:     General: No scleral icterus.    Extraocular Movements: Extraocular movements intact.     Conjunctiva/sclera: Conjunctivae normal.  Cardiovascular:     Rate and Rhythm: Normal rate and regular rhythm.     Pulses: Normal pulses.  Pulmonary:     Effort: Pulmonary effort is normal. No respiratory distress.     Breath sounds: No stridor. No wheezing.     Comments: Respirations even and unlabored. Lungs CTAB. Musculoskeletal:        General: Normal range of motion.     Cervical back: Normal range  of motion.  Skin:    General: Skin is warm and dry.     Coloration: Skin is not pale.     Findings: No erythema or rash.  Neurological:     Mental Status: She is alert and oriented to person, place, and time.     Coordination: Coordination normal.  Psychiatric:        Behavior: Behavior normal.     ED Results / Procedures / Treatments   Labs (all labs ordered are listed, but only abnormal results are displayed) Labs Reviewed - No data to display  EKG None  Radiology No results found.  Procedures Procedures    Medications Ordered in ED Medications   ketorolac  (TORADOL ) 15 MG/ML injection 15 mg (has no administration in time range)  acetaminophen  (TYLENOL ) tablet 1,000 mg (1,000 mg Oral Given 03/23/23 0531)    ED Course/ Medical Decision Making/ A&P                                 Medical Decision Making Risk OTC drugs. Prescription drug management.   This patient presents to the ED for concern of allergic reaction, this involves an extensive number of treatment options, and is a complaint that carries with it a high risk of complications and morbidity.  The differential diagnosis includes allergic reaction vs anaphylaxis vs anxiety attack   Co morbidities that complicate the patient evaluation  HTN HLD Anxiety   Additional history obtained:  Additional history obtained from EMS personnel   Cardiac Monitoring:  The patient was maintained on a cardiac monitor.  I personally viewed and interpreted the cardiac monitored which showed an underlying rhythm of: NSR   Medicines ordered and prescription drug management:  I ordered medication including Tylenol  and Toradol  for headache  Reevaluation of the patient after these medicines showed that the patient improved I have reviewed the patients home medicines and have made adjustments as needed   Test Considered:  CXR   Problem List / ED Course:  43 year old female presents to the emergency department due to concern for allergic reaction.  She reported sensation of lip and tongue swelling as well as paresthesias to her lower mouth.  States that symptoms improved with Benadryl. EMS reported that they did not note any angioedema on scene.  She was given Benadryl, however, for symptom control.  Given her history of anxiety, I question whether or not her symptoms may actually have been related to dysregulated breathing associated with an acute anxiety episode.  Benadryl could certainly also help alleviate this exacerbation. No associated urticaria, inability to swallow,  drooling, wheezing, hypoxemia, vomiting.  Clinically, no concern for anaphylaxis.  The patient has been monitored in the emergency department for several hours without decompensation or recurrence of symptoms.  Vital stable.  Do not feel further emergent workup is presently indicated.  She has been encouraged to follow-up with her primary care doctor for reassessment.   Reevaluation:  After the interventions noted above, I reevaluated the patient and found that they have :improved   Social Determinants of Health:  Good social support; family at bedside   Dispostion:  After consideration of the diagnostic results and the patients response to treatment, I feel that the patent would benefit from outpatient PCP f/u PRN. Return precautions discussed and provided. Patient discharged in stable condition with no unaddressed concerns.          Final Clinical Impression(s) / ED Diagnoses  Final diagnoses:  Allergic reaction, initial encounter    Rx / DC Orders ED Discharge Orders     None         Keith Sor, PA-C 03/23/23 0606    Raford Lenis, MD 03/23/23 [phone]

## 2023-03-23 NOTE — Discharge Instructions (Addendum)
You were monitored in the ED with continued improvement in your symptoms. Follow up with your primary care doctor for recheck. Return if symptoms persist or worsen.

## 2023-03-23 NOTE — ED Triage Notes (Signed)
 Pt BIB [facility] from home c/o possible allergic reaction with c/o lower lip and tongue numbness, head fullness, headache since 0100, pt given Benadryl 50mg  oral, v/s en route 142/79, HR 76, RR 19m 100% RA, CBG 143, EMS reports negative scale, pt reports some improvement with Benadryl

## 2023-11-05 IMAGING — DX DG KNEE COMPLETE 4+V*L*
4 series · 4 of 4 positions shown · non-contrast
Comparison: Radiograph dated October 03, 2012

CLINICAL DATA: Pain

EXAM:
LEFT KNEE - COMPLETE 4 VIEW

[knee ap]
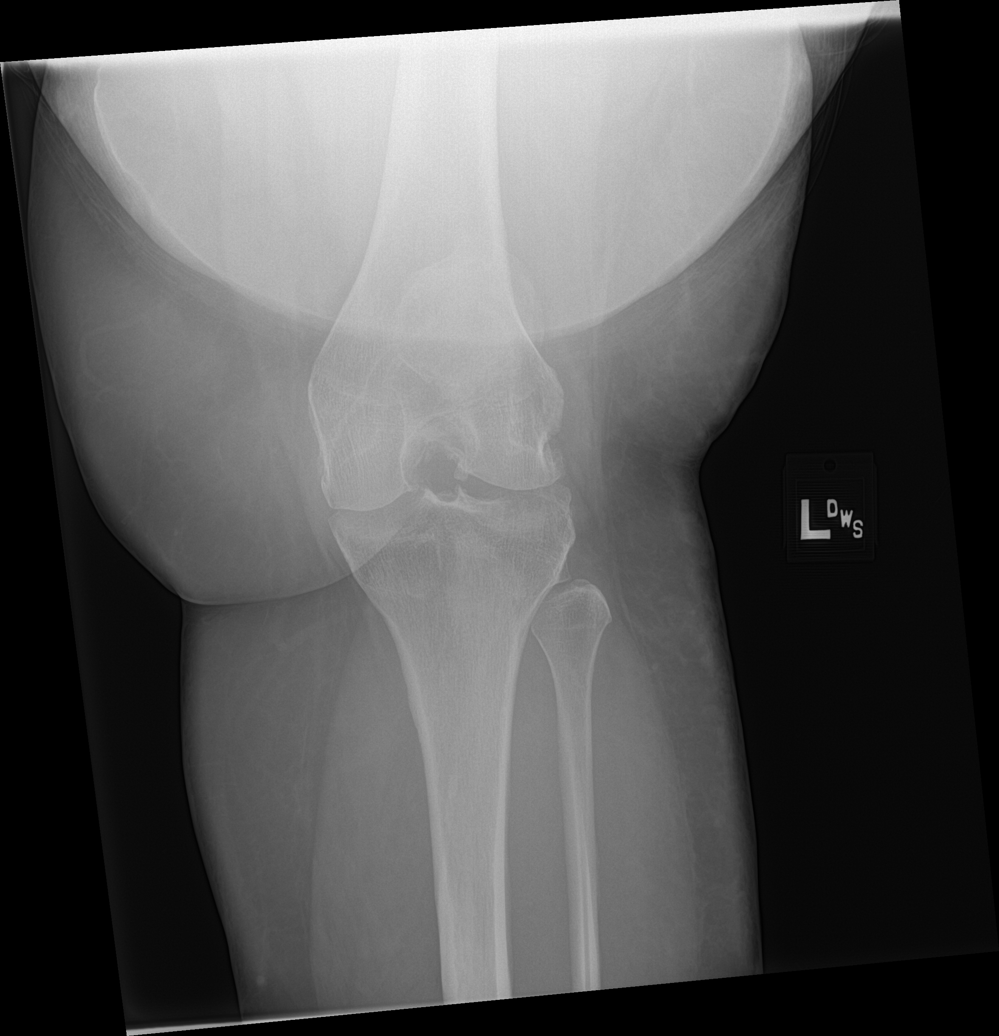

[knee lat]
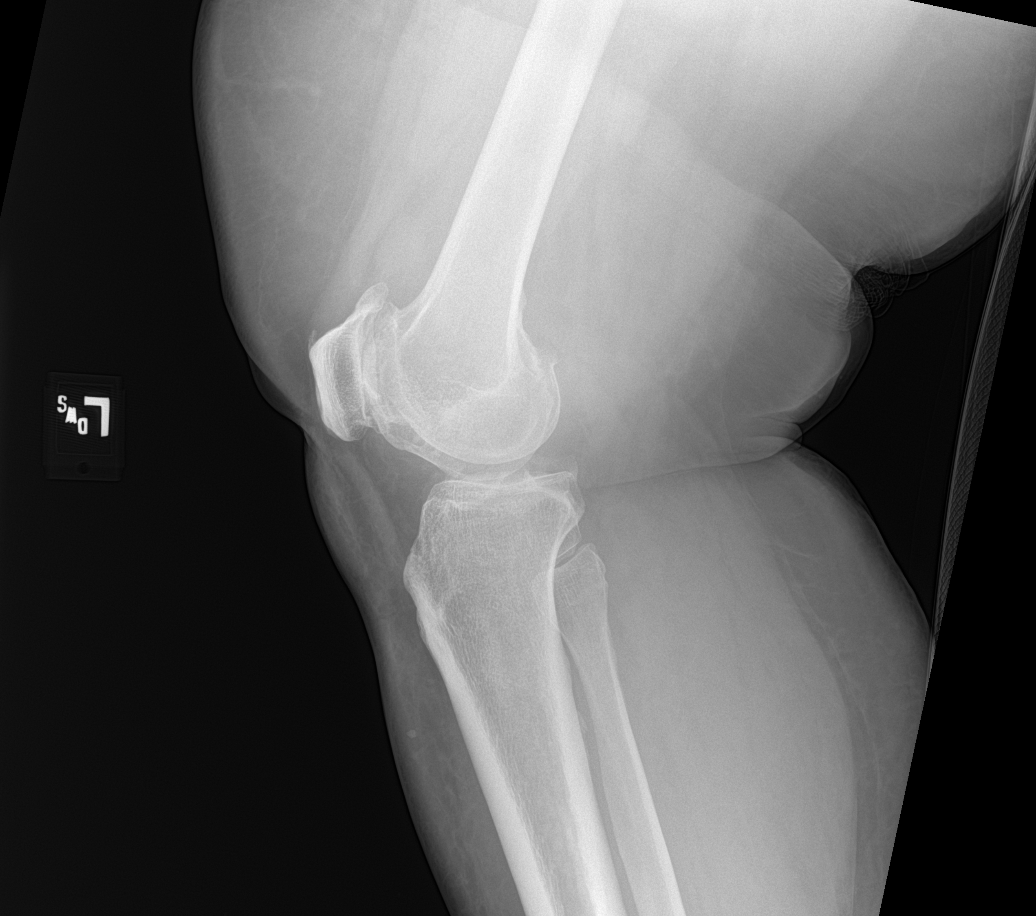

[knee obl (1 of 2)]
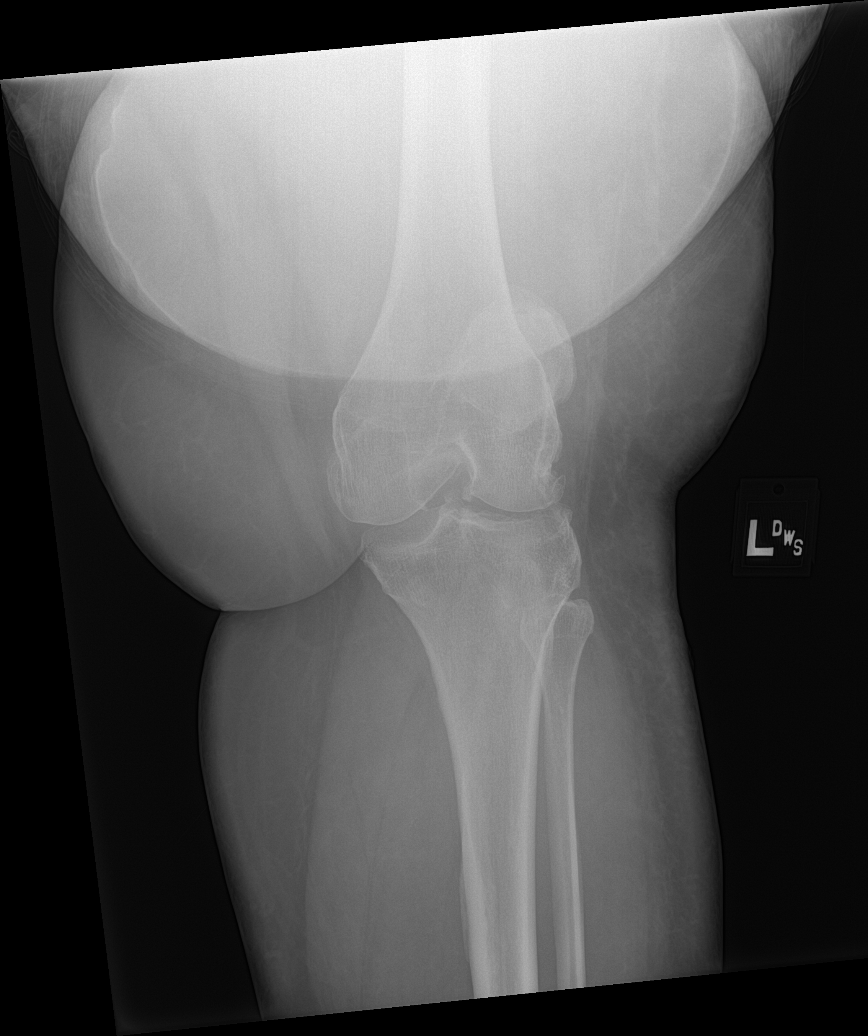

[knee obl (2 of 2)]
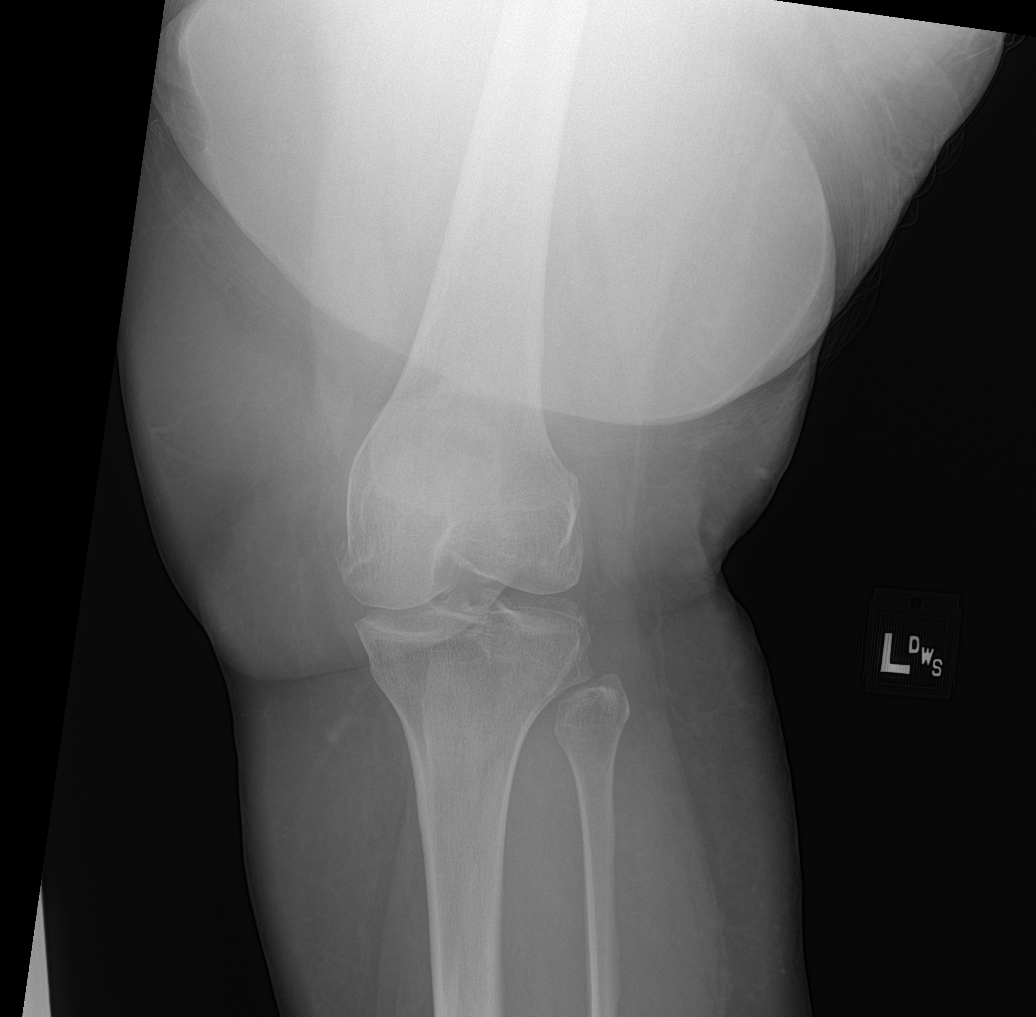

[4 of 4 positions shown; findings below may reference images not displayed]

FINDINGS: No evidence of fracture, dislocation, or joint effusion.
Tricompartmental degenerative changes, severe degenerative changes
of the patellofemoral compartment and moderate degenerative changes
at the lateral compartment. Soft tissues are unremarkable.
IMPRESSION: Moderate to severe degenerative joint disease, progressed when
compared with 4868 prior.
